# Patient Record
Sex: Female | Born: 1995 | Hispanic: Yes | Smoking: Never smoker
Health system: Southern US, Community
[De-identification: ages and names within clinical notes are randomized; demographics above are authoritative.]

## PROBLEM LIST (undated history)

## (undated) ENCOUNTER — Inpatient Hospital Stay (HOSPITAL_COMMUNITY): Payer: Self-pay

## (undated) ENCOUNTER — Inpatient Hospital Stay (HOSPITAL_COMMUNITY): Admission: RE | Payer: Self-pay | Source: Ambulatory Visit

## (undated) DIAGNOSIS — J45909 Unspecified asthma, uncomplicated: Secondary | ICD-10-CM

## (undated) HISTORY — PX: NO PAST SURGERIES: SHX2092

---

## 2015-01-06 ENCOUNTER — Emergency Department (HOSPITAL_COMMUNITY)
Admission: EM | Admit: 2015-01-06 | Discharge: 2015-01-07 | Disposition: A | Discharge status: Home / Self Care | Payer: Self-pay | Attending: Emergency Medicine | Admitting: Emergency Medicine

## 2015-01-06 ENCOUNTER — Encounter (HOSPITAL_COMMUNITY): Payer: Self-pay | Admitting: Emergency Medicine

## 2015-01-06 DIAGNOSIS — R109 Unspecified abdominal pain: Secondary | ICD-10-CM

## 2015-01-06 DIAGNOSIS — O99511 Diseases of the respiratory system complicating pregnancy, first trimester: Secondary | ICD-10-CM | POA: Insufficient documentation

## 2015-01-06 DIAGNOSIS — O9989 Other specified diseases and conditions complicating pregnancy, childbirth and the puerperium: Secondary | ICD-10-CM | POA: Insufficient documentation

## 2015-01-06 DIAGNOSIS — R1031 Right lower quadrant pain: Secondary | ICD-10-CM | POA: Insufficient documentation

## 2015-01-06 DIAGNOSIS — J45909 Unspecified asthma, uncomplicated: Secondary | ICD-10-CM | POA: Insufficient documentation

## 2015-01-06 DIAGNOSIS — Z3201 Encounter for pregnancy test, result positive: Secondary | ICD-10-CM

## 2015-01-06 DIAGNOSIS — N831 Corpus luteum cyst of ovary, unspecified side: Secondary | ICD-10-CM

## 2015-01-06 DIAGNOSIS — R197 Diarrhea, unspecified: Secondary | ICD-10-CM | POA: Insufficient documentation

## 2015-01-06 DIAGNOSIS — Z3A01 Less than 8 weeks gestation of pregnancy: Secondary | ICD-10-CM | POA: Insufficient documentation

## 2015-01-06 HISTORY — DX: Unspecified asthma, uncomplicated: J45.909

## 2015-01-06 LAB — CBC WITH DIFFERENTIAL/PLATELET
Basophils Absolute: 0 10*3/uL (ref 0.0–0.1)
Basophils Relative: 0 % (ref 0–1)
Eosinophils Absolute: 0.2 10*3/uL (ref 0.0–0.7)
Eosinophils Relative: 3 % (ref 0–5)
HEMATOCRIT: 36.5 % (ref 36.0–46.0)
Hemoglobin: 12.2 g/dL (ref 12.0–15.0)
Lymphocytes Relative: 30 % (ref 12–46)
Lymphs Abs: 2.1 10*3/uL (ref 0.7–4.0)
MCH: 29.6 pg (ref 26.0–34.0)
MCHC: 33.4 g/dL (ref 30.0–36.0)
MCV: 88.6 fL (ref 78.0–100.0)
MONO ABS: 0.6 10*3/uL (ref 0.1–1.0)
Monocytes Relative: 9 % (ref 3–12)
NEUTROS PCT: 58 % (ref 43–77)
Neutro Abs: 4 10*3/uL (ref 1.7–7.7)
Platelets: 224 10*3/uL (ref 150–400)
RBC: 4.12 MIL/uL (ref 3.87–5.11)
RDW: 12.4 % (ref 11.5–15.5)
WBC: 6.9 10*3/uL (ref 4.0–10.5)

## 2015-01-06 LAB — URINALYSIS, ROUTINE W REFLEX MICROSCOPIC
BILIRUBIN URINE: NEGATIVE
Glucose, UA: NEGATIVE mg/dL
HGB URINE DIPSTICK: NEGATIVE
Ketones, ur: NEGATIVE mg/dL
NITRITE: POSITIVE — AB
Protein, ur: NEGATIVE mg/dL
Specific Gravity, Urine: 1.02 (ref 1.005–1.030)
UROBILINOGEN UA: 0.2 mg/dL (ref 0.0–1.0)
pH: 6 (ref 5.0–8.0)

## 2015-01-06 LAB — COMPREHENSIVE METABOLIC PANEL
ALBUMIN: 4.3 g/dL (ref 3.5–5.0)
ALT: 22 U/L (ref 14–54)
AST: 24 U/L (ref 15–41)
Alkaline Phosphatase: 89 U/L (ref 38–126)
Anion gap: 9 (ref 5–15)
BUN: 9 mg/dL (ref 6–20)
CALCIUM: 9.4 mg/dL (ref 8.9–10.3)
CO2: 23 mmol/L (ref 22–32)
Chloride: 107 mmol/L (ref 101–111)
Creatinine, Ser: 0.42 mg/dL — ABNORMAL LOW (ref 0.44–1.00)
GFR calc non Af Amer: >60 mL/min (ref >60)
Glucose, Bld: 80 mg/dL (ref 70–99)
Potassium: 3.6 mmol/L (ref 3.5–5.1)
Sodium: 139 mmol/L (ref 135–145)
TOTAL PROTEIN: 7.4 g/dL (ref 6.5–8.1)
Total Bilirubin: 0.5 mg/dL (ref 0.3–1.2)

## 2015-01-06 LAB — URINE MICROSCOPIC-ADD ON

## 2015-01-06 LAB — LIPASE, BLOOD: LIPASE: 27 U/L (ref 22–51)

## 2015-01-06 LAB — POC URINE PREG, ED: Preg Test, Ur: POSITIVE — AB

## 2015-01-06 MED ORDER — ACETAMINOPHEN 325 MG PO TABS
650.0000 mg | ORAL_TABLET | Freq: Once | ORAL | Status: AC
  Administered 2015-01-07: 650 mg via ORAL
  Filled 2015-01-06: qty 2

## 2015-01-06 MED ORDER — HYDROCODONE-ACETAMINOPHEN 5-325 MG PO TABS
1.0000 | ORAL_TABLET | Freq: Once | ORAL | Status: DC
  Filled 2015-01-06: qty 1

## 2015-01-06 NOTE — ED Notes (Signed)
Pt. reports low abdominal pain with nausea , emesis, diarrhea and chills onset yesterday , denies dysuria .

## 2015-01-06 NOTE — ED Provider Notes (Signed)
CSN: [id number]     Arrival date & time 01/06/15  2212 History   First MD Initiated Contact with Patient 01/06/15 2304     Chief Complaint  Patient presents with  . Abdominal Pain    (Consider location/radiation/quality/duration/timing/severity/associated sxs/prior Treatment) HPI  Kaitlyn Hardin is a 19 yo female presenting with report of abd pain and positive home pregnancy test. She states she started having intermittent cramping abd pain yesterday.  Her period was late, so she took a home pregnancy test and it was positive. She reports an abnormal period April 1st -April 8th She states she has had some nausea also and one episode of vomiting yesterday.  She states the pain is currently improved but it tends to come and go. She denies any fevers, chills, vomiting, vaginal discharge, vaginal bleeding or dysuria.   Past Medical History  Diagnosis Date  . Asthma    History reviewed. No pertinent past surgical history. No family history on file. History  Substance Use Topics  . Smoking status: Never Smoker   . Smokeless tobacco: Not on file  . Alcohol Use: No   OB History    No data available     Review of Systems  Constitutional: Negative for fever and chills.  HENT: Negative for sore throat.   Eyes: Negative for visual disturbance.  Respiratory: Negative for cough and shortness of breath.   Cardiovascular: Negative for chest pain and leg swelling.  Gastrointestinal: Positive for nausea, abdominal pain and diarrhea. Negative for vomiting.  Genitourinary: Positive for menstrual problem. Negative for dysuria.  Musculoskeletal: Negative for myalgias.  Skin: Negative for rash.  Neurological: Negative for weakness, numbness and headaches.    Allergies  Review of patient's allergies indicates no known allergies.  Home Medications   Prior to Admission medications   Not on File   BP 126/82 mmHg  Pulse 95  Temp(Src) 97.4 F (36.3 C) (Oral)  Resp 18  Ht 4\' 11"  (1.499 m)  Wt  96 lb 12.8 oz (43.908 kg)  BMI 19.54 kg/m2  SpO2 99%  LMP 12/09/2014 Physical Exam  Constitutional: She appears well-developed and well-nourished. No distress.  HENT:  Head: Normocephalic and atraumatic.  Mouth/Throat: Oropharynx is clear and moist. No oropharyngeal exudate.  Eyes: Conjunctivae are normal.  Neck: Neck supple. No thyromegaly present.  Cardiovascular: Normal rate, regular rhythm and intact distal pulses.   Pulmonary/Chest: Effort normal and breath sounds normal. No respiratory distress. She has no wheezes. She has no rales. She exhibits no tenderness.  Abdominal: Soft. She exhibits no distension and no mass. There is tenderness in the right lower quadrant. There is no rigidity, no rebound, no guarding, no tenderness at McBurney's point and negative Murphy's sign.    Musculoskeletal: She exhibits no tenderness.  Lymphadenopathy:    She has no cervical adenopathy.  Neurological: She is alert.  Skin: Skin is warm and dry. No rash noted. She is not diaphoretic.  Psychiatric: She has a normal mood and affect.  Nursing note and vitals reviewed.   ED Course  Procedures (including critical care time) Labs Review Labs Reviewed  COMPREHENSIVE METABOLIC PANEL - Abnormal; Notable for the following:    Creatinine, Ser 0.42 (*)    All other components within normal limits  URINALYSIS, ROUTINE W REFLEX MICROSCOPIC - Abnormal; Notable for the following:    APPearance CLOUDY (*)    Nitrite POSITIVE (*)    Leukocytes, UA MODERATE (*)    All other components within normal limits  URINE MICROSCOPIC-ADD  ON - Abnormal; Notable for the following:    Squamous Epithelial / LPF MANY (*)    Bacteria, UA MANY (*)    All other components within normal limits  HCG, QUANTITATIVE, PREGNANCY - Abnormal; Notable for the following:    hCG, Beta Chain, Quant, S 297 (*)    All other components within normal limits  POC URINE PREG, ED - Abnormal; Notable for the following:    Preg Test, Ur  POSITIVE (*)    All other components within normal limits  CBC WITH DIFFERENTIAL/PLATELET  LIPASE, BLOOD    Imaging Review US Ob Comp Less 14 Wks  01/07/2015   CLINICAL DATA:  Lower abdominal pain, last menstrual period November 11, 2014. Assess for ectopic pregnancy.  EXAM: OBSTETRIC <14 WK [place] AND TRANSVAGINAL OB US  TECHNIQUE: Both transabdominal and transvaginal ultrasound examinations were performed for complete evaluation of the gestation as well as the maternal uterus, adnexal regions, and pelvic cul-de-sac. Transvaginal technique was performed to assess early pregnancy.  COMPARISON:  None.  FINDINGS: Intrauterine gestational sac: Not present  Yolk sac:  Not present  Embryo:  Not present  Cardiac Activity: Not present  Maternal uterus/adnexae: RIGHT ovary 3.1 x 2.3 x 2.4 cm, LEFT is 3.5 x 2.9 x 2.7 cm. Bilateral adnexa did not demonstrate normal morphology and echogenicity, suspect is corpus luteal cyst on the LEFT measuring up to 17 mm. Endometrium is distended 18 mm with homogeneous echogenicity. Small amount of free fluid in the pelvis.  IMPRESSION: No intrauterine pregnancy identified. Suspected LEFT corpus luteal cyst, without imaging characteristics of ectopic pregnancy though, Doppler sonography not applied. Recommend serial HCG and follow-up pelvic ultrasound in 10-12 days.  Acute findings discussed with and reconfirmed by Dr.KEVIN CAMPOS on 01/07/2015 at 1:57 am.   Electronically Signed   By: Awilda Metro   On: 01/07/2015 01:57   US Ob Transvaginal  01/07/2015   CLINICAL DATA:  Lower abdominal pain, last menstrual period November 11, 2014. Assess for ectopic pregnancy.  EXAM: OBSTETRIC <14 WK [place] AND TRANSVAGINAL OB US  TECHNIQUE: Both transabdominal and transvaginal ultrasound examinations were performed for complete evaluation of the gestation as well as the maternal uterus, adnexal regions, and pelvic cul-de-sac. Transvaginal technique was performed to assess early pregnancy.  COMPARISON:   None.  FINDINGS: Intrauterine gestational sac: Not present  Yolk sac:  Not present  Embryo:  Not present  Cardiac Activity: Not present  Maternal uterus/adnexae: RIGHT ovary 3.1 x 2.3 x 2.4 cm, LEFT is 3.5 x 2.9 x 2.7 cm. Bilateral adnexa did not demonstrate normal morphology and echogenicity, suspect is corpus luteal cyst on the LEFT measuring up to 17 mm. Endometrium is distended 18 mm with homogeneous echogenicity. Small amount of free fluid in the pelvis.  IMPRESSION: No intrauterine pregnancy identified. Suspected LEFT corpus luteal cyst, without imaging characteristics of ectopic pregnancy though, Doppler sonography not applied. Recommend serial HCG and follow-up pelvic ultrasound in 10-12 days.  Acute findings discussed with and reconfirmed by Dr.KEVIN CAMPOS on 01/07/2015 at 1:57 am.   Electronically Signed   By: Awilda Metro   On: 01/07/2015 01:57     EKG Interpretation None      MDM   Final diagnoses:  Corpus luteum cyst  Positive pregnancy test  RLQ abdominal pain   19 yo female presenting with abd cramping, right sided abd pain and positive home pregnancy test. Discussed case with Dr. Patria Mane.  Her urine pregnancy is positive, her UA shows moderate leukocytes and  nitrites despite no urinary symptoms. Quant HCG is low considering her LMP: 297, no other significant lab abnormalities. Transvaginal US shows no IUP, but possible left corpus luteal cyst. As pt's pain is right sided, pt is safe to be discharged with [facility] for serial HCGs. Her pain was managed in the ED. Pt is well-appearing, in no acute distress and vital signs reviewed and not concerning. She appears safe to be discharged.  Return precautions provided. Pt aware of plan and in agreement.  Filed Vitals:   01/07/15 0215 01/07/15 0230 01/07/15 0245 01/07/15 0256  BP: 103/69 101/64 99/65 106/68  Pulse: 97 93 96 108  Temp:    98.8 F (37.1 C)  TempSrc:    Oral  Resp:    14   Height:      Weight:      SpO2: 99% 100% 100% 100%   Meds given in ED:  Medications  acetaminophen (TYLENOL) tablet 650 mg (650 mg Oral Given 01/07/15 0007)    There are no discharge medications for this patient.     Harle BattiestElizabeth Antawan Mchugh, NP 01/07/15 1528  Azalia BilisKevin Campos, MD 01/08/15 [id number]

## 2015-01-06 NOTE — ED Notes (Signed)
Dr. campos at bedside

## 2015-01-07 ENCOUNTER — Emergency Department (HOSPITAL_COMMUNITY): Payer: Self-pay

## 2015-01-07 LAB — HCG, QUANTITATIVE, PREGNANCY: HCG, BETA CHAIN, QUANT, S: 297 m[IU]/mL — AB (ref <5)

## 2015-01-07 NOTE — ED Notes (Signed)
Pt. Left with all belongings and refused wheelchair 

## 2015-01-07 NOTE — Discharge Instructions (Signed)
Please follow the directions provided. Is very important for you to follow-up at the [facility] in 48 hours, (Sunday morning) for a re-check of your pregnancy blood levels.  You may take tylenol every 4 hours for pain.  Don't hesitate to return for any new, worsening or concerning symptoms.     SEEK IMMEDIATE MEDICAL CARE IF:  You have heavy bleeding from the vagina.  Your pelvic pain increases.  You feel light-headed or faint.  You have chills.  You have pain with urination or blood in your urine.  You have uncontrolled diarrhea or vomiting.  You have a fever or persistent symptoms for more than 3 days.  You have a fever and your symptoms suddenly get worse.  You are being physically or sexually abused.

## 2015-01-07 NOTE — ED Notes (Signed)
Patient transported to US - With [place] NT

## 2015-01-07 NOTE — ED Notes (Addendum)
Ultrasound requests that a female chaperon accompany pt

## 2015-01-09 ENCOUNTER — Inpatient Hospital Stay (HOSPITAL_COMMUNITY)
Admission: AD | Admit: 2015-01-09 | Discharge: 2015-01-09 | Disposition: A | Discharge status: Home / Self Care | Payer: Self-pay | Source: Ambulatory Visit | Attending: Obstetrics & Gynecology | Admitting: Obstetrics & Gynecology

## 2015-01-09 ENCOUNTER — Encounter (HOSPITAL_COMMUNITY): Payer: Self-pay | Admitting: *Deleted

## 2015-01-09 DIAGNOSIS — O26899 Other specified pregnancy related conditions, unspecified trimester: Secondary | ICD-10-CM

## 2015-01-09 DIAGNOSIS — Z3A01 Less than 8 weeks gestation of pregnancy: Secondary | ICD-10-CM | POA: Insufficient documentation

## 2015-01-09 DIAGNOSIS — R109 Unspecified abdominal pain: Secondary | ICD-10-CM | POA: Insufficient documentation

## 2015-01-09 DIAGNOSIS — O9989 Other specified diseases and conditions complicating pregnancy, childbirth and the puerperium: Secondary | ICD-10-CM | POA: Insufficient documentation

## 2015-01-09 LAB — URINALYSIS, ROUTINE W REFLEX MICROSCOPIC
BILIRUBIN URINE: NEGATIVE
Glucose, UA: NEGATIVE mg/dL
HGB URINE DIPSTICK: NEGATIVE
KETONES UR: NEGATIVE mg/dL
NITRITE: POSITIVE — AB
PROTEIN: NEGATIVE mg/dL
Specific Gravity, Urine: 1.015 (ref 1.005–1.030)
UROBILINOGEN UA: 0.2 mg/dL (ref 0.0–1.0)
pH: 6.5 (ref 5.0–8.0)

## 2015-01-09 LAB — HCG, QUANTITATIVE, PREGNANCY: hCG, Beta Chain, Quant, S: 981 m[IU]/mL — ABNORMAL HIGH (ref <5)

## 2015-01-09 LAB — URINE MICROSCOPIC-ADD ON

## 2015-01-09 NOTE — Discharge Instructions (Signed)
First Trimester of Pregnancy The first trimester of pregnancy is from week 1 until the end of week 12 (months 1 through 3). During this time, your baby will begin to develop inside you. At 6-8 weeks, the eyes and face are formed, and the heartbeat can be seen on ultrasound. At the end of 12 weeks, all the baby's organs are formed. Prenatal care is all the medical care you receive before the birth of your baby. Make sure you get good prenatal care and follow all of your doctor's instructions. HOME CARE  Medicines  Take medicine only as told by your doctor. Some medicines are safe and some are not during pregnancy.  Take your prenatal vitamins as told by your doctor.  Take medicine that helps you poop (stool softener) as needed if your doctor says it is okay. Diet  Eat regular, healthy meals.  Your doctor will tell you the amount of weight gain that is right for you.  Avoid raw meat and uncooked cheese.  If you feel sick to your stomach (nauseous) or throw up (vomit):  Eat 4 or 5 small meals a day instead of 3 large meals.  Try eating a few soda crackers.  Drink liquids between meals instead of during meals.  If you have a hard time pooping (constipation):  Eat high-fiber foods like fresh vegetables, fruit, and whole grains.  Drink enough fluids to keep your pee (urine) clear or pale yellow. Activity and Exercise  Exercise only as told by your doctor. Stop exercising if you have cramps or pain in your lower belly (abdomen) or low back.  Try to avoid standing for long periods of time. Move your legs often if you must stand in one place for a long time.  Avoid heavy lifting.  Wear low-heeled shoes. Sit and stand up straight.  You can have sex unless your doctor tells you not to. Relief of Pain or Discomfort  Wear a good support bra if your breasts are sore.  Take warm water baths (sitz baths) to soothe pain or discomfort caused by hemorrhoids. Use hemorrhoid cream if your  doctor says it is okay.  Rest with your legs raised if you have leg cramps or low back pain.  Wear support hose if you have puffy, bulging veins (varicose veins) in your legs. Raise (elevate) your feet for 15 minutes, 3-4 times a day. Limit salt in your diet. Prenatal Care  Schedule your prenatal visits by the twelfth week of pregnancy.  Write down your questions. Take them to your prenatal visits.  Keep all your prenatal visits as told by your doctor. Safety  Wear your seat belt at all times when driving.  Make a list of emergency phone numbers. The list should include numbers for family, friends, the hospital, and police and fire departments. General Tips  Ask your doctor for a referral to a local prenatal class. Begin classes no later than at the start of month 6 of your pregnancy.  Ask for help if you need counseling or help with nutrition. Your doctor can give you advice or tell you where to go for help.  Do not use hot tubs, steam rooms, or saunas.  Do not douche or use tampons or scented sanitary pads.  Do not cross your legs for long periods of time.  Avoid litter boxes and soil used by cats.  Avoid all smoking, herbs, and alcohol. Avoid drugs not approved by your doctor.  Visit your dentist. At home, brush your teeth   with a soft toothbrush. Be gentle when you floss. GET HELP IF:  You are dizzy.  You have mild cramps or pressure in your lower belly.  You have a nagging pain in your belly area.  You continue to feel sick to your stomach, throw up, or have watery poop (diarrhea).  You have a bad smelling fluid coming from your vagina.  You have pain with peeing (urination).  You have increased puffiness (swelling) in your face, hands, legs, or ankles. GET HELP RIGHT AWAY IF:   You have a fever.  You are leaking fluid from your vagina.  You have spotting or bleeding from your vagina.  You have very bad belly cramping or pain.  You gain or lose weight  rapidly.  You throw up blood. It may look like coffee grounds.  You are around people who have German measles, fifth disease, or chickenpox.  You have a very bad headache.  You have shortness of breath.  You have any kind of trauma, such as from a fall or a car accident. Document Released: 02/06/2008 Document Revised: 01/04/2014 Document Reviewed: 06/30/2013 ExitCare Patient Information 2015 [employer]. This information is not intended to replace advice given to you by your health care provider. Make sure you discuss any questions you have with your health care provider.  

## 2015-01-09 NOTE — MAU Provider Note (Signed)
Ms. Kaitlyn Hardin  is a 19 y.o. G1P0 at 724w3d who presents to MAU today for follow-up quant hCG after 48 hours. The patient denies abdominal pain, bleeding, UTI symptoms or fever today. [facility] for abdominal pain in pregnancy that started 01/05/15. US showed no IUGS or YS and quant hCG was 297. Patient denies symptoms of UTI, but had concern for UTI based on UA results. Urine was not cultured and UTI was not treated.   BP 111/65 mmHg  Pulse 94  Temp(Src) 98.2 F (36.8 C) (Oral)  LMP 12/09/2014  CONSTITUTIONAL: Well-developed, well-nourished female in no acute distress.  ENT: External right and left ear normal.  EYES: EOM intact, conjunctivae normal.  MUSCULOSKELETAL: Normal range of motion.  CARDIOVASCULAR: Regular heart rate RESPIRATORY: Normal effort NEUROLOGICAL: Alert and oriented to person, place, and time.  SKIN: Skin is warm and dry. No rash noted. Not diaphoretic. No erythema. No pallor. PSYCH: Normal mood and affect. Normal behavior. Normal judgment and thought content.  Results for orders placed or performed during the hospital encounter of 01/09/15 (from the past 24 hour(s))  hCG, quantitative, pregnancy     Status: Abnormal   Collection Time: 01/09/15  3:02 PM  Result Value Ref Range   hCG, Beta Chain, Quant, S 981 (H) <5 mIU/mL  Urinalysis, Routine w reflex microscopic     Status: Abnormal   Collection Time: 01/09/15  3:10 PM  Result Value Ref Range   Color, Urine YELLOW YELLOW   APPearance CLEAR CLEAR   Specific Gravity, Urine 1.015 1.005 - 1.030   pH 6.5 5.0 - 8.0   Glucose, UA NEGATIVE NEGATIVE mg/dL   Hgb urine dipstick NEGATIVE NEGATIVE   Bilirubin Urine NEGATIVE NEGATIVE   Ketones, ur NEGATIVE NEGATIVE mg/dL   Protein, ur NEGATIVE NEGATIVE mg/dL   Urobilinogen, UA 0.2 0.0 - 1.0 mg/dL   Nitrite POSITIVE (A) NEGATIVE   Leukocytes, UA MODERATE (A) NEGATIVE  Urine microscopic-add on     Status: Abnormal   Collection Time: 01/09/15  3:10 PM   Result Value Ref Range   Squamous Epithelial / LPF FEW (A) RARE   WBC, UA 11-20 <3 WBC/hpf   Bacteria, UA MANY (A) RARE   Urine-Other MUCOUS PRESENT    Results for Kaitlyn Hardin, [name] (MRN [id number]) as of 01/09/2015 15:57  Ref. Range 01/06/2015 22:22 01/06/2015 22:48 01/07/2015 01:32 01/09/2015 15:02  HCG, Beta Chain, Quant, S Latest Ref Range: <5 mIU/mL 297 (H)   981 (H)    MDM Patient asymptomatic of UTI, urine culture sent  A: Appropriate rise in quant hCG after 48 hours Abdominal pain in pregnancy Pregnancy of unknown location  P: Discharge home First trimester/ectopic precautions discussed Patient will return for follow-up US in 1 week. Order placed. They will call the patient with an appointment time. Patient to return to MAU after US for results Patient may return to MAU as needed or if her condition were to change or worsen   Marny LowensteinJulie N Wenzel, PA-C 01/09/2015 3:54 PM

## 2015-01-09 NOTE — MAU Note (Signed)
Pt presents to MAU for repeat  BHCG. Denies any vaginal bleeding or pain 

## 2015-01-11 LAB — CULTURE, OB URINE: Colony Count: >=100000

## 2015-01-12 ENCOUNTER — Telehealth (HOSPITAL_COMMUNITY): Payer: Self-pay | Admitting: Obstetrics and Gynecology

## 2015-01-12 NOTE — Telephone Encounter (Signed)
+   urine culture. Keflex sent to the pharmacy.

## 2015-01-14 ENCOUNTER — Encounter: Payer: Self-pay | Admitting: Medical

## 2015-01-14 ENCOUNTER — Telehealth: Payer: Self-pay | Admitting: Medical

## 2015-01-14 NOTE — Telephone Encounter (Signed)
+   urine culture. Patient needs Rx for Keflex. No Pharmacy listed in Epic. Attempted to contact patient x 3. Also attempted to contact emergency contact without answer. Certified letter sent asking patient to call MAU for results. Will need to confirm pharmacy and send Rx.   Marny LowensteinJulie N Lasheka Kempner, PA-C 01/14/2015 8:25 AM

## 2015-01-17 ENCOUNTER — Inpatient Hospital Stay (HOSPITAL_COMMUNITY)
Admission: AD | Admit: 2015-01-17 | Discharge: 2015-01-17 | Disposition: A | Discharge status: Home / Self Care | Payer: Self-pay | Source: Ambulatory Visit | Attending: Family Medicine | Admitting: Family Medicine

## 2015-01-17 ENCOUNTER — Ambulatory Visit (HOSPITAL_COMMUNITY)
Admission: RE | Admit: 2015-01-17 | Discharge: 2015-01-17 | Disposition: A | Discharge status: Home / Self Care | Payer: Self-pay | Source: Ambulatory Visit | Attending: Medical | Admitting: Medical

## 2015-01-17 DIAGNOSIS — O9989 Other specified diseases and conditions complicating pregnancy, childbirth and the puerperium: Secondary | ICD-10-CM | POA: Insufficient documentation

## 2015-01-17 DIAGNOSIS — O26899 Other specified pregnancy related conditions, unspecified trimester: Secondary | ICD-10-CM

## 2015-01-17 DIAGNOSIS — R109 Unspecified abdominal pain: Secondary | ICD-10-CM | POA: Insufficient documentation

## 2015-01-17 MED ORDER — CEPHALEXIN 500 MG PO CAPS: 500.0000 mg | ORAL_CAPSULE | Freq: Four times a day (QID) | ORAL | Status: DC

## 2015-01-17 NOTE — MAU Provider Note (Signed)
Patient here for a follow up US. Discussed results with the patient and answered all questions. Patient has already started prenatal care. Patient had a positive urine culture and we were unable to reach her by phone. Keflex sent to her pharmacy today. She denies vaginal bleeding.   [place] Ob Transvaginal  01/17/2015   CLINICAL DATA:  Pregnant, unsure LMP  EXAM: TRANSVAGINAL OB ULTRASOUND  TECHNIQUE: Transvaginal ultrasound was performed for complete evaluation of the gestation as well as the maternal uterus, adnexal regions, and pelvic cul-de-sac.  COMPARISON:  None.  FINDINGS: Intrauterine gestational sac: Visualized/normal in shape.  Yolk sac:  Present  Embryo:  Not visualized  MSD: 9.6  mm   5 w   5  d  Maternal uterus/adnexae: No subchronic hemorrhage.  Bilateral ovaries are within normal limits.  Trace pelvic fluid.  IMPRESSION: Single intrauterine gestation with yolk sac, measuring 5 weeks 5 days by mean sac diameter.  No fetal pole is visualized.  Consider follow-up pelvic ultrasound in 14 days to confirm viability as clinically warranted.   Electronically Signed   By: Charline BillsSriyesh  Krishnan M.D.   On: 01/17/2015 14:31        Duane LopeJennifer I Shiya Fogelman, NP 01/17/2015 2:50 PM

## 2015-01-23 ENCOUNTER — Encounter (HOSPITAL_COMMUNITY): Payer: Self-pay | Admitting: Emergency Medicine

## 2015-01-23 ENCOUNTER — Emergency Department (HOSPITAL_COMMUNITY)
Admission: EM | Admit: 2015-01-23 | Discharge: 2015-01-24 | Disposition: A | Discharge status: Home / Self Care | Payer: Self-pay | Attending: Emergency Medicine | Admitting: Emergency Medicine

## 2015-01-23 DIAGNOSIS — O2341 Unspecified infection of urinary tract in pregnancy, first trimester: Secondary | ICD-10-CM | POA: Insufficient documentation

## 2015-01-23 DIAGNOSIS — N390 Urinary tract infection, site not specified: Secondary | ICD-10-CM

## 2015-01-23 DIAGNOSIS — O99511 Diseases of the respiratory system complicating pregnancy, first trimester: Secondary | ICD-10-CM | POA: Insufficient documentation

## 2015-01-23 DIAGNOSIS — O9989 Other specified diseases and conditions complicating pregnancy, childbirth and the puerperium: Secondary | ICD-10-CM | POA: Insufficient documentation

## 2015-01-23 DIAGNOSIS — O219 Vomiting of pregnancy, unspecified: Secondary | ICD-10-CM

## 2015-01-23 DIAGNOSIS — O210 Mild hyperemesis gravidarum: Secondary | ICD-10-CM | POA: Insufficient documentation

## 2015-01-23 DIAGNOSIS — Z3A01 Less than 8 weeks gestation of pregnancy: Secondary | ICD-10-CM | POA: Insufficient documentation

## 2015-01-23 DIAGNOSIS — J45909 Unspecified asthma, uncomplicated: Secondary | ICD-10-CM | POA: Insufficient documentation

## 2015-01-23 DIAGNOSIS — R000 Tachycardia, unspecified: Secondary | ICD-10-CM | POA: Insufficient documentation

## 2015-01-23 LAB — I-STAT CHEM 8, ED
BUN: 6 mg/dL (ref 6–20)
CALCIUM ION: 1.18 mmol/L (ref 1.12–1.23)
CREATININE: 0.4 mg/dL — AB (ref 0.44–1.00)
Chloride: 102 mmol/L (ref 101–111)
Glucose, Bld: 97 mg/dL (ref 65–99)
HCT: 37 % (ref 36.0–46.0)
HEMOGLOBIN: 12.6 g/dL (ref 12.0–15.0)
POTASSIUM: 3.8 mmol/L (ref 3.5–5.1)
SODIUM: 136 mmol/L (ref 135–145)
TCO2: 20 mmol/L (ref 0–100)

## 2015-01-23 MED ORDER — PROMETHAZINE HCL 25 MG/ML IJ SOLN
25.0000 mg | Freq: Once | INTRAMUSCULAR | Status: AC
  Administered 2015-01-23: 25 mg via INTRAVENOUS
  Filled 2015-01-23: qty 1

## 2015-01-23 MED ORDER — SODIUM CHLORIDE 0.9 % IV BOLUS (SEPSIS)
1000.0000 mL | INTRAVENOUS | Status: AC
Start: 2015-01-23 — End: 2015-01-24
  Administered 2015-01-23: 1000 mL via INTRAVENOUS

## 2015-01-23 MED ORDER — DEXTROSE 5 % IV SOLN: 1.0000 g | Freq: Once | INTRAVENOUS | Status: DC

## 2015-01-23 NOTE — ED Provider Notes (Signed)
CSN: [id number]     Arrival date & time 01/23/15  2137 History   First MD Initiated Contact with Patient 01/23/15 2156     Chief Complaint  Patient presents with  . Emesis  . Dizziness   (Consider location/radiation/quality/duration/timing/severity/associated sxs/prior Treatment) HPI  Kaitlyn Hardin is a 19 yo nausea and vomiting. She is 6 weeks and 4 days pregnant with a confirmed IUP. She presented to the ER, 2 weeks ago with right lower quadrant pain and was sent for follow-up at [facility], MAU for serial HCGs.  She was not having urinary symptoms or vaginal symptoms at her initial presentation to the ED but she reports today that she has been having some dysuria for a few days.  She was prescribed Kelfex after visiting [facility] hospital but has never started taking them because "no one told me how to take them." Her most bothersome complaint is 3 days of nausea and vomiting. She reports 1 episode of vomiting daily for the last 3 days.  She has not followed back up at [facility] because she did not know she was supposed to go back there for other concerns or appointments.  She reports mild suprapubic pain, but no back pain, abdominal cramping, or vaginal bleeding.   Past Medical History  Diagnosis Date  . Asthma    History reviewed. No pertinent past surgical history. No family history on file. History  Substance Use Topics  . Smoking status: Never Smoker   . Smokeless tobacco: Not on file  . Alcohol Use: No   OB History    Gravida Para Term Preterm AB TAB SAB Ectopic Multiple Living   1              Review of Systems  Constitutional: Negative for fever and chills.  HENT: Negative for sore throat.   Eyes: Negative for visual disturbance.  Respiratory: Negative for cough and shortness of breath.   Cardiovascular: Negative for chest pain and leg swelling.  Gastrointestinal: Positive for nausea and vomiting. Negative for diarrhea.  Genitourinary: Negative for dysuria.    Musculoskeletal: Negative for myalgias.  Skin: Negative for rash.  Neurological: Negative for weakness, numbness and headaches.      Allergies  Review of patient's allergies indicates no known allergies.  Home Medications   Prior to Admission medications   Medication Sig Start Date End Date Taking? Authorizing Provider  cephALEXin (KEFLEX) 500 MG capsule Take 1 capsule (500 mg total) by mouth 4 (four) times daily. 01/17/15   Harolyn Rutherford Rasch, NP   BP 105/65 mmHg  Pulse 98  Temp(Src) 98.2 F (36.8 C) (Oral)  Resp 16  Ht  (1.499 m)  Wt 95 lb (43.092 kg)  BMI 19.18 kg/m2  SpO2 100%  LMP 12/09/2014 Physical Exam  Constitutional: She appears well-developed and well-nourished. No distress.  HENT:  Head: Normocephalic and atraumatic.  Mouth/Throat: Oropharynx is clear and moist.  Eyes: Conjunctivae are normal.  Neck: Neck supple.  Cardiovascular: Regular rhythm and intact distal pulses.  Tachycardia present.  Exam reveals no gallop and no friction rub.   No murmur heard. Pulmonary/Chest: Effort normal and breath sounds normal. No respiratory distress. She has no wheezes. She has no rales. She exhibits no tenderness.  Abdominal: Soft. There is tenderness in the suprapubic area.    Musculoskeletal: She exhibits no tenderness.  Lymphadenopathy:    She has no cervical adenopathy.  Neurological: She is alert.  Skin: Skin is warm and dry. No rash noted. She is  not diaphoretic.  Psychiatric: She has a normal mood and affect.  Nursing note and vitals reviewed.   ED Course  Procedures (including critical care time) Labs Review Labs Reviewed  URINALYSIS, ROUTINE W REFLEX MICROSCOPIC - Abnormal; Notable for the following:    Color, Urine AMBER (*)    APPearance CLOUDY (*)    Ketones, ur >80 (*)    Nitrite POSITIVE (*)    Leukocytes, UA LARGE (*)    All other components within normal limits  URINE MICROSCOPIC-ADD ON - Abnormal; Notable for the following:    Squamous  Epithelial / LPF MANY (*)    Bacteria, UA MANY (*)    All other components within normal limits  I-STAT CHEM 8, ED - Abnormal; Notable for the following:    Creatinine, Ser 0.40 (*)    All other components within normal limits  URINE CULTURE    Imaging Review No results found.   EKG Interpretation None      MDM   Final diagnoses:  UTI (lower urinary tract infection)  Nausea and vomiting during pregnancy prior to [redacted] weeks gestation   19 yo, 6 week and 4 day pregnant pt with nausea and vomiting, 1 time/day for 3 days.  [facility] and denies any abd cramping or vaginal bleeding. She has been previously diagnosed with UTI at her OB/GYN appointment but has not taken her antibiotic, which she has at home, because she says no one told her how to take it. Her urine here confirms that she still has her urinary tract infection. She also has greater than 80 ketones, so she was aggressively rehydrated with 2 L of normal saline. She was given a her first dose of antibiotics in the ED and I discussed how to take her antibiotic and how to find the instructions on the bottle about how to take her antibiotic. Her nausea has not resolved after treatment but due to her early pregnancy discussed with patient that nausea symptoms are reassuring that her pregnancy is progressing. Her vomiting has resolved and she is tolerating by mouth's well.  I discussed her case with Dr. Lynelle DoctorKnapp. Pt is well-appearing, in no acute distress and vital signs reviewed and not concerning. She appears safe to be discharged.  Discharge include follow-up with her OB/GYN.  Return precautions provided. Pt aware of plan and in agreement.   Filed Vitals:   01/24/15 0000 01/24/15 0015 01/24/15 0030 01/24/15 0115  BP: 82/60 87/54 89/46  91/47  Pulse:  107 107 110  Temp:      TempSrc:      Resp:      Height:      Weight:      SpO2:  100% 100% 100%   Meds given in ED:  Medications  sodium  chloride 0.9 % bolus 1,000 mL (0 mLs Intravenous Stopped 01/24/15 0042)  promethazine (PHENERGAN) injection 25 mg (25 mg Intravenous Given 01/23/15 2343)  cephALEXin (KEFLEX) capsule 500 mg (500 mg Oral Given 01/24/15 0047)  acetaminophen (TYLENOL) tablet 650 mg (650 mg Oral Given 01/24/15 0047)  sodium chloride 0.9 % bolus 1,000 mL (0 mLs Intravenous Stopped 01/24/15 0201)    Discharge Medication List as of 01/24/2015  1:35 AM         Harle BattiestElizabeth Maddi Collar, NP 01/25/15 [id number]  Devoria AlbeIva Knapp, MD 01/28/15 2304

## 2015-01-23 NOTE — ED Notes (Addendum)
C/o dizziness and vomiting since Thursday.  Denies pain.  Reports vomited x 1 today. States she is [redacted] weeks pregnant.

## 2015-01-24 LAB — URINALYSIS, ROUTINE W REFLEX MICROSCOPIC
Bilirubin Urine: NEGATIVE
GLUCOSE, UA: NEGATIVE mg/dL
Hgb urine dipstick: NEGATIVE
Ketones, ur: >80 mg/dL — AB
NITRITE: POSITIVE — AB
Protein, ur: NEGATIVE mg/dL
Specific Gravity, Urine: 1.026 (ref 1.005–1.030)
Urobilinogen, UA: 0.2 mg/dL (ref 0.0–1.0)
pH: 7.5 (ref 5.0–8.0)

## 2015-01-24 LAB — URINE MICROSCOPIC-ADD ON

## 2015-01-24 MED ORDER — SODIUM CHLORIDE 0.9 % IV BOLUS (SEPSIS)
1000.0000 mL | INTRAVENOUS | Status: AC
  Administered 2015-01-24: 1000 mL via INTRAVENOUS

## 2015-01-24 MED ORDER — ACETAMINOPHEN 325 MG PO TABS
650.0000 mg | ORAL_TABLET | Freq: Once | ORAL | Status: AC
  Administered 2015-01-24: 650 mg via ORAL
  Filled 2015-01-24: qty 2

## 2015-01-24 MED ORDER — PROMETHAZINE HCL 25 MG RE SUPP: 25.0000 mg | Freq: Four times a day (QID) | RECTAL | Status: DC | PRN

## 2015-01-24 MED ORDER — CEPHALEXIN 250 MG PO CAPS
500.0000 mg | ORAL_CAPSULE | Freq: Once | ORAL | Status: AC
  Administered 2015-01-24: 500 mg via ORAL
  Filled 2015-01-24: qty 2

## 2015-01-24 NOTE — Discharge Instructions (Signed)
Please follow the directions provided. Be sure to follow-up at the [facility] regarding your visit tonight. You can call in the morning to make a follow-up appointment in a few days. He may also go 24 hours a day to the [facility] emergency room for any urgent concerns. Continue to drink plenty of fluids by mouth to stay well hydrated. Use the Phenergan as needed for nausea. Please take the Keflex that you have at home to treat the infection in your urine. The directions for how often to take should be on the bottle. Don't hesitate to return for any new, worsening, or concerning symptoms.   SEEK IMMEDIATE MEDICAL CARE IF:  You have severe back pain or lower abdominal pain.  You develop chills.  You have nausea or vomiting.  You have continued burning or discomfort with urination.  SEEK IMMEDIATE MEDICAL CARE IF:  You have persistent and uncontrolled nausea and vomiting.  You pass out (faint

## 2015-01-27 LAB — URINE CULTURE

## 2015-01-28 ENCOUNTER — Telehealth (HOSPITAL_BASED_OUTPATIENT_CLINIC_OR_DEPARTMENT_OTHER): Payer: Self-pay | Admitting: Emergency Medicine

## 2015-01-28 NOTE — Telephone Encounter (Signed)
Post ED Visit - Positive Culture Follow-up  Culture report reviewed by antimicrobial stewardship pharmacist: []  Wes Dulaney, Pharm.D., BCPS []  Celedonio MiyamotoJeremy Frens, Pharm.D., BCPS []  Georgina PillionElizabeth Martin, Pharm.D., BCPS []  ViningMinh Pham, [address]D., BCPS, AAHIVP [x]  Estella HuskMichelle Turner, Pharm.D., BCPS, AAHIVP []  Elder CyphersLorie Poole, [address]Ph[address]D., BCPS  Positive urine  Culture E. coli Treated with keflex, organism sensitive to the same and no further patient follow-up is required at this time.  Kaitlyn MullMiller, Kaitlyn Hardin 01/28/2015, 12:00 PM

## 2015-03-28 ENCOUNTER — Inpatient Hospital Stay (HOSPITAL_COMMUNITY)
Admission: AD | Admit: 2015-03-28 | Discharge: 2015-03-28 | Disposition: A | Discharge status: Home / Self Care | Payer: Medicaid Other | Source: Ambulatory Visit | Attending: Obstetrics & Gynecology | Admitting: Obstetrics & Gynecology

## 2015-03-28 ENCOUNTER — Encounter (HOSPITAL_COMMUNITY): Payer: Self-pay | Admitting: *Deleted

## 2015-03-28 DIAGNOSIS — R1030 Lower abdominal pain, unspecified: Secondary | ICD-10-CM | POA: Diagnosis present

## 2015-03-28 DIAGNOSIS — Z3A14 14 weeks gestation of pregnancy: Secondary | ICD-10-CM | POA: Diagnosis not present

## 2015-03-28 DIAGNOSIS — O2342 Unspecified infection of urinary tract in pregnancy, second trimester: Secondary | ICD-10-CM | POA: Diagnosis not present

## 2015-03-28 LAB — URINALYSIS, ROUTINE W REFLEX MICROSCOPIC
BILIRUBIN URINE: NEGATIVE
GLUCOSE, UA: NEGATIVE mg/dL
Ketones, ur: 15 mg/dL — AB
NITRITE: NEGATIVE
PH: 7 (ref 5.0–8.0)
PROTEIN: NEGATIVE mg/dL
Specific Gravity, Urine: 1.025 (ref 1.005–1.030)
UROBILINOGEN UA: 0.2 mg/dL (ref 0.0–1.0)

## 2015-03-28 LAB — URINE MICROSCOPIC-ADD ON

## 2015-03-28 MED ORDER — CEPHALEXIN 250 MG PO CAPS: 250.0000 mg | ORAL_CAPSULE | Freq: Four times a day (QID) | ORAL | Status: DC

## 2015-03-28 NOTE — Discharge Instructions (Signed)
Second Trimester of Pregnancy °The second trimester is from week 13 through week 28, months 4 through 6. The second trimester is often a time when you feel your best. Your body has also adjusted to being pregnant, and you begin to feel better physically. Usually, morning sickness has lessened or quit completely, you may have more energy, and you may have an increase in appetite. The second trimester is also a time when the fetus is growing rapidly. At the end of the sixth month, the fetus is about 9 inches long and weighs about 1½ pounds. You will likely begin to feel the baby move (quickening) between 18 and 20 weeks of the pregnancy. °BODY CHANGES °Your body goes through many changes during pregnancy. The changes vary from woman to woman.  °· Your weight will continue to increase. You will notice your lower abdomen bulging out. °· You may begin to get stretch marks on your hips, abdomen, and breasts. °· You may develop headaches that can be relieved by medicines approved by your health care provider. °· You may urinate more often because the fetus is pressing on your bladder. °· You may develop or continue to have heartburn as a result of your pregnancy. °· You may develop constipation because certain hormones are causing the muscles that push waste through your intestines to slow down. °· You may develop hemorrhoids or swollen, bulging veins (varicose veins). °· You may have back pain because of the weight gain and pregnancy hormones relaxing your joints between the bones in your pelvis and as a result of a shift in weight and the muscles that support your balance. °· Your breasts will continue to grow and be tender. °· Your gums may bleed and may be sensitive to brushing and flossing. °· Dark spots or blotches (chloasma, mask of pregnancy) may develop on your face. This will likely fade after the baby is born. °· A dark line from your belly button to the pubic area (linea nigra) may appear. This will likely fade  after the baby is born. °· You may have changes in your hair. These can include thickening of your hair, rapid growth, and changes in texture. Some women also have hair loss during or after pregnancy, or hair that feels dry or thin. Your hair will most likely return to normal after your baby is born. °WHAT TO EXPECT AT YOUR PRENATAL VISITS °During a routine prenatal visit: °· You will be weighed to make sure you and the fetus are growing normally. °· Your blood pressure will be taken. °· Your abdomen will be measured to track your baby's growth. °· The fetal heartbeat will be listened to. °· Any test results from the previous visit will be discussed. °Your health care provider may ask you: °· How you are feeling. °· If you are feeling the baby move. °· If you have had any abnormal symptoms, such as leaking fluid, bleeding, severe headaches, or abdominal cramping. °· If you have any questions. °Other tests that may be performed during your second trimester include: °· Blood tests that check for: °¨ Low iron levels (anemia). °¨ Gestational diabetes (between 24 and 28 weeks). °¨ Rh antibodies. °· Urine tests to check for infections, diabetes, or protein in the urine. °· An ultrasound to confirm the proper growth and development of the baby. °· An amniocentesis to check for possible genetic problems. °· Fetal screens for spina bifida and Down syndrome. °HOME CARE INSTRUCTIONS  °· Avoid all smoking, herbs, alcohol, and unprescribed   drugs. These chemicals affect the formation and growth of the baby.  Follow your health care provider's instructions regarding medicine use. There are medicines that are either safe or unsafe to take during pregnancy.  Exercise only as directed by your health care provider. Experiencing uterine cramps is a good sign to stop exercising.  Continue to eat regular, healthy meals.  Wear a good support bra for breast tenderness.  Do not use hot tubs, steam rooms, or saunas.  Wear your  seat belt at all times when driving.  Avoid raw meat, uncooked cheese, cat litter boxes, and soil used by cats. These carry germs that can cause birth defects in the baby.  Take your prenatal vitamins.  Try taking a stool softener (if your health care provider approves) if you develop constipation. Eat more high-fiber foods, such as fresh vegetables or fruit and whole grains. Drink plenty of fluids to keep your urine clear or pale yellow.  Take warm sitz baths to soothe any pain or discomfort caused by hemorrhoids. Use hemorrhoid cream if your health care provider approves.  If you develop varicose veins, wear support hose. Elevate your feet for 15 minutes, 3-4 times a day. Limit salt in your diet.  Avoid heavy lifting, wear low heel shoes, and practice good posture.  Rest with your legs elevated if you have leg cramps or low back pain.  Visit your dentist if you have not gone yet during your pregnancy. Use a soft toothbrush to brush your teeth and be gentle when you floss.  A sexual relationship may be continued unless your health care provider directs you otherwise.  Continue to go to all your prenatal visits as directed by your health care provider. SEEK MEDICAL CARE IF:   You have dizziness.  You have mild pelvic cramps, pelvic pressure, or nagging pain in the abdominal area.  You have persistent nausea, vomiting, or diarrhea.  You have a bad smelling vaginal discharge.  You have pain with urination. SEEK IMMEDIATE MEDICAL CARE IF:   You have a fever.  You are leaking fluid from your vagina.  You have spotting or bleeding from your vagina.  You have severe abdominal cramping or pain.  You have rapid weight gain or loss.  You have shortness of breath with chest pain.  You notice sudden or extreme swelling of your face, hands, ankles, feet, or legs.  You have not felt your baby move in over an hour.  You have severe headaches that do not go away with  medicine.  You have vision changes. Document Released: 08/14/2001 Document Revised: 08/25/2013 Document Reviewed: 10/21/2012 [facility] Patient Information 2015 [place], [place]. This information is not intended to replace advice given to you by your health care provider. Make sure you discuss any questions you have with your health care provider. Urinary Tract Infection A urinary tract infection (UTI) can occur any place along the urinary tract. The tract includes the kidneys, ureters, bladder, and urethra. A type of germ called bacteria often causes a UTI. UTIs are often helped with antibiotic medicine.  HOME CARE   If given, take antibiotics as told by your doctor. Finish them even if you start to feel better.  Drink enough fluids to keep your pee (urine) clear or pale yellow.  Avoid tea, drinks with caffeine, and bubbly (carbonated) drinks.  Pee often. Avoid holding your pee in for a long time.  Pee before and after having sex (intercourse).  Wipe from front to back after you poop (bowel  movement) if you are a woman. Use each tissue only once. GET HELP RIGHT AWAY IF:   You have back pain.  You have lower belly (abdominal) pain.  You have chills.  You feel sick to your stomach (nauseous).  You throw up (vomit).  Your burning or discomfort with peeing does not go away.  You have a fever.  Your symptoms are not better in 3 days. MAKE SURE YOU:   Understand these instructions.  Will watch your condition.  Will get help right away if you are not doing well or get worse. Document Released: 02/06/2008 Document Revised: 05/14/2012 Document Reviewed: 03/20/2012 [facility] Patient Information 2015 [place], [place]. This information is not intended to replace advice given to you by your health care provider. Make sure you discuss any questions you have with your health care provider.

## 2015-03-28 NOTE — MAU Provider Note (Signed)
History     CSN: [id number]  Arrival date and time: 03/28/15 1602   None     Chief Complaint  Patient presents with  . Abdominal Pain   HPI Kaitlyn Hardin is 19 y.o. G1P0 [redacted]w[redacted]d weeks presenting with lower abdominal pain that began yesterday and dysuria X 1 week.   She denies vaginal bleeding and discharge.   She has not begun prenatal care- waiting for insurance then plans to see [facility].  Seen in MAU Mid-May with + urine culture. Rx was sent, certified letter was sent when contact with patient could not be made.  She states she did not get a letter.  The person with her told me the address may be correct.   Past Medical History  Diagnosis Date  . Asthma     No past surgical history on file.  No family history on file.  History  Substance Use Topics  . Smoking status: Never Smoker   . Smokeless tobacco: Not on file  . Alcohol Use: No    Allergies: No Known Allergies  Prescriptions prior to admission  Medication Sig Dispense Refill Last Dose  . Prenatal Vit-Fe Fumarate-FA (PRENATAL MULTIVITAMIN) TABS tablet Take 1 tablet by mouth daily at 12 noon.   03/27/2015 at Unknown time  . cephALEXin (KEFLEX) 500 MG capsule Take 1 capsule (500 mg total) by mouth 4 (four) times daily. (Patient not taking: Reported on 01/23/2015) 20 capsule 0 Completed Course at Unknown time  . promethazine (PHENERGAN) 25 MG suppository Place 1 suppository (25 mg total) rectally every 6 (six) hours as needed for nausea or vomiting. (Patient not taking: Reported on 03/28/2015) 6 each 0 Not Taking at Unknown time    Review of Systems  Constitutional: Negative for fever and chills.  Cardiovascular: Negative for chest pain.  Gastrointestinal: Positive for abdominal pain. Negative for nausea and vomiting.  Genitourinary: Positive for dysuria, urgency and frequency. Negative for hematuria and flank pain.       Negative for vaginal discharge or bleeding.   Physical Exam   Blood pressure 100/64, pulse 84,  temperature 97.5 F (36.4 C), temperature source Oral, resp. rate 16, last menstrual period 12/18/2014.  Physical Exam  Nursing note and vitals reviewed. Constitutional: She is oriented to person, place, and time. She appears well-developed and well-nourished. No distress.  HENT:  Head: Normocephalic.  Neck: Normal range of motion.  Cardiovascular: Normal rate.   Respiratory: Effort normal.  GI: Soft. She exhibits no distension and no mass. There is no tenderness. There is no rebound, no guarding and no CVA tenderness.  Genitourinary:  Fetal heart tones by doppler at 154 BPM. Pelvic deferred-negative for vaginal bleeding or discharge.  Musculoskeletal:  + for lower back pain not flank pain  Neurological: She is alert and oriented to person, place, and time.  Skin: Skin is warm and dry.  Psychiatric: She has a normal mood and affect. Her behavior is normal.   Results for orders placed or performed during the hospital encounter of 03/28/15 (from the past 24 hour(s))  Urinalysis, Routine w reflex microscopic (not at [facility])     Status: Abnormal   Collection Time: 03/28/15  5:16 PM  Result Value Ref Range   Color, Urine YELLOW YELLOW   APPearance CLOUDY (A) CLEAR   Specific Gravity, Urine 1.025 1.005 - 1.030   pH 7.0 5.0 - 8.0   Glucose, UA NEGATIVE NEGATIVE mg/dL   Hgb urine dipstick TRACE (A) NEGATIVE   Bilirubin Urine NEGATIVE NEGATIVE  Ketones, ur 15 (A) NEGATIVE mg/dL   Protein, ur NEGATIVE NEGATIVE mg/dL   Urobilinogen, UA 0.2 0.0 - 1.0 mg/dL   Nitrite NEGATIVE NEGATIVE   Leukocytes, UA MODERATE (A) NEGATIVE  Urine microscopic-add on     Status: Abnormal   Collection Time: 03/28/15  5:16 PM  Result Value Ref Range   Squamous Epithelial / LPF MANY (A) RARE   WBC, UA 3-6 <3 WBC/hpf   Bacteria, UA MANY (A) RARE   Urine-Other AMORPHOUS URATES/PHOSPHATES    MAU Course  Procedures  Urine culture pending  MDM MSE Labs Exam  Assessment and Plan  A:  UTI      Second  trimester pregnancy  P:  Rx for Keflex to pharmacy      Encouraged her to continue vitamins and begin prenatal care as soon as possible. KEY,EVE M 03/28/2015, 5:58 PM

## 2015-03-28 NOTE — MAU Note (Signed)
Lower abd pain since last night, also dysuria for the last week.  Denies bleeding, feels like she has some type of discharge but she hasn't been to bathroom to see.  No PNC.

## 2015-03-30 LAB — CULTURE, OB URINE: SPECIAL REQUESTS: NORMAL

## 2015-05-05 LAB — OB RESULTS CONSOLE ABO/RH: RH TYPE: POSITIVE

## 2015-05-05 LAB — OB RESULTS CONSOLE RUBELLA ANTIBODY, IGM: Rubella: UNDETERMINED

## 2015-05-05 LAB — OB RESULTS CONSOLE GC/CHLAMYDIA
Chlamydia: NEGATIVE
Gonorrhea: NEGATIVE

## 2015-05-05 LAB — OB RESULTS CONSOLE RPR: RPR: NONREACTIVE

## 2015-05-05 LAB — OB RESULTS CONSOLE ANTIBODY SCREEN: ANTIBODY SCREEN: NEGATIVE

## 2015-05-05 LAB — OB RESULTS CONSOLE HIV ANTIBODY (ROUTINE TESTING): HIV: NONREACTIVE

## 2015-05-05 LAB — OB RESULTS CONSOLE HEPATITIS B SURFACE ANTIGEN: HEP B S AG: NEGATIVE

## 2015-08-04 ENCOUNTER — Inpatient Hospital Stay (HOSPITAL_COMMUNITY)
Admission: AD | Admit: 2015-08-04 | Discharge: 2015-08-04 | Disposition: A | Discharge status: Home / Self Care | Payer: Medicaid Other | Source: Ambulatory Visit | Attending: Obstetrics and Gynecology | Admitting: Obstetrics and Gynecology

## 2015-08-04 ENCOUNTER — Encounter (HOSPITAL_COMMUNITY): Payer: Self-pay | Admitting: *Deleted

## 2015-08-04 DIAGNOSIS — N760 Acute vaginitis: Secondary | ICD-10-CM | POA: Diagnosis not present

## 2015-08-04 DIAGNOSIS — D649 Anemia, unspecified: Secondary | ICD-10-CM | POA: Diagnosis not present

## 2015-08-04 DIAGNOSIS — B9689 Other specified bacterial agents as the cause of diseases classified elsewhere: Secondary | ICD-10-CM

## 2015-08-04 DIAGNOSIS — O99019 Anemia complicating pregnancy, unspecified trimester: Secondary | ICD-10-CM

## 2015-08-04 DIAGNOSIS — O23593 Infection of other part of genital tract in pregnancy, third trimester: Secondary | ICD-10-CM | POA: Diagnosis not present

## 2015-08-04 DIAGNOSIS — Z3A33 33 weeks gestation of pregnancy: Secondary | ICD-10-CM | POA: Diagnosis not present

## 2015-08-04 DIAGNOSIS — O99820 Streptococcus B carrier state complicating pregnancy: Secondary | ICD-10-CM | POA: Diagnosis not present

## 2015-08-04 DIAGNOSIS — B951 Streptococcus, group B, as the cause of diseases classified elsewhere: Secondary | ICD-10-CM

## 2015-08-04 DIAGNOSIS — O4700 False labor before 37 completed weeks of gestation, unspecified trimester: Secondary | ICD-10-CM

## 2015-08-04 DIAGNOSIS — J45909 Unspecified asthma, uncomplicated: Secondary | ICD-10-CM | POA: Diagnosis not present

## 2015-08-04 DIAGNOSIS — O4703 False labor before 37 completed weeks of gestation, third trimester: Secondary | ICD-10-CM

## 2015-08-04 DIAGNOSIS — R109 Unspecified abdominal pain: Secondary | ICD-10-CM | POA: Diagnosis present

## 2015-08-04 DIAGNOSIS — O99013 Anemia complicating pregnancy, third trimester: Secondary | ICD-10-CM | POA: Insufficient documentation

## 2015-08-04 DIAGNOSIS — O99513 Diseases of the respiratory system complicating pregnancy, third trimester: Secondary | ICD-10-CM | POA: Diagnosis not present

## 2015-08-04 HISTORY — DX: Anemia complicating pregnancy, unspecified trimester: O99.019

## 2015-08-04 LAB — URINALYSIS, ROUTINE W REFLEX MICROSCOPIC
BILIRUBIN URINE: NEGATIVE
Glucose, UA: NEGATIVE mg/dL
HGB URINE DIPSTICK: NEGATIVE
KETONES UR: NEGATIVE mg/dL
Nitrite: NEGATIVE
PROTEIN: NEGATIVE mg/dL
SPECIFIC GRAVITY, URINE: 1.015 (ref 1.005–1.030)
pH: 7.5 (ref 5.0–8.0)

## 2015-08-04 LAB — WET PREP, GENITAL
SPERM: NONE SEEN
Trich, Wet Prep: NONE SEEN
Yeast Wet Prep HPF POC: NONE SEEN

## 2015-08-04 LAB — URINE MICROSCOPIC-ADD ON

## 2015-08-04 MED ORDER — METRONIDAZOLE 500 MG PO TABS: 500.0000 mg | ORAL_TABLET | Freq: Two times a day (BID) | ORAL | Status: DC

## 2015-08-04 NOTE — MAU Provider Note (Signed)
History  19 yo G1P0 @ 33.3 wks presents unannounced to MAU w/ c/o lower abdominal cramping off and on since earlier today. + FM. Denies bleeding, leaking, vaginal itching/odor, dysuria, urgency, diarrhea or constipation. Reports Intercourse in the past 24-48 hrs. Admits to drinking one 16.9 oz bottle of water today.   Patient Active Problem List   Diagnosis Date Noted  . Anemia affecting pregnancy 08/04/2015  . Positive GBS test 08/04/2015  . BV (bacterial vaginosis) 08/04/2015  . Premature uterine contractions, antepartum 08/04/2015  . Asthma 08/04/2015    Chief Complaint  Patient presents with  . Contractions   HPI As above OB History    Gravida Para Term Preterm AB TAB SAB Ectopic Multiple Living   1               Past Medical History  Diagnosis Date  . Asthma   . Infection     Past Surgical History  Procedure Laterality Date  . No past surgeries      History reviewed. No pertinent family history.  Social History  Substance Use Topics  . Smoking status: Never Smoker   . Smokeless tobacco: None  . Alcohol Use: No    Allergies: No Known Allergies  No prescriptions prior to admission    ROS  +FM -VB -LOF +Cramping Physical Exam   Blood pressure 109/52, pulse 99, temperature 97.5 F (36.4 C), resp. rate 18, last menstrual period 12/18/2014. Results for orders placed or performed during the hospital encounter of 08/04/15 (from the past 24 hour(s))  Urinalysis, Routine w reflex microscopic (not at [facility])     Status: Abnormal   Collection Time: 08/04/15  6:40 PM  Result Value Ref Range   Color, Urine YELLOW YELLOW   APPearance HAZY (A) CLEAR   Specific Gravity, Urine 1.015 1.005 - 1.030   pH 7.5 5.0 - 8.0   Glucose, UA NEGATIVE NEGATIVE mg/dL   Hgb urine dipstick NEGATIVE NEGATIVE   Bilirubin Urine NEGATIVE NEGATIVE   Ketones, ur NEGATIVE NEGATIVE mg/dL   Protein, ur NEGATIVE NEGATIVE mg/dL   Nitrite NEGATIVE NEGATIVE   Leukocytes, UA SMALL (A)  NEGATIVE  Urine microscopic-add on     Status: Abnormal   Collection Time: 08/04/15  6:40 PM  Result Value Ref Range   Squamous Epithelial / LPF 0-5 (A) NONE SEEN   WBC, UA 0-5 0 - 5 WBC/hpf   RBC / HPF 0-5 0 - 5 RBC/hpf   Bacteria, UA RARE (A) NONE SEEN  Wet prep, genital     Status: Abnormal   Collection Time: 08/04/15  7:45 PM  Result Value Ref Range   Yeast Wet Prep HPF POC NONE SEEN NONE SEEN   Trich, Wet Prep NONE SEEN NONE SEEN   Clue Cells Wet Prep HPF POC PRESENT (A) NONE SEEN   WBC, Wet Prep HPF POC FEW (A) NONE SEEN   Sperm NONE SEEN      Physical Exam Gen: NAD Abdomen: gravid, soft, NT, no guarding or rebound NEFG Vagina: No lesions. No blood in vault fFN deferred due to recent intercourse Speculum: Moderate amount of white milky discharge present, no odor. Sample collected for wet prep and GC/CT. Cvx visually and digitally closed FHRT: BL 135 w/ moderate variability, abundant accels, no decels Toco: Irritability initially, followed by irregular ctxs, then none ED Course  UA Urine culture Wet prep GC/CT Assessment: PTCs BV  Plan: Prolonged monitoring due to ctxs - uterine irritability/ctxs quiesced after oral hydration Metronidazole 7 day  course Discussed in length the importance of hydration. Feel ctxs are also a result of BV and intercourse. Advised to take medication as prescribed and refrain from intercourse during treatment Recommended frequent rest periods PTL precautions F/U on cultures OB f/u as scheduled    Sherre ScarletWILLIAMS, Samariyah Cowles CNM, MS 08/04/2015 9:57 PM

## 2015-08-04 NOTE — Discharge Instructions (Signed)

## 2015-08-04 NOTE — MAU Note (Signed)
Pt reports having lowing abd pain and cramping off and on this afternoon. Stated she was told her cervix is dilated to 1-2 cm. Denies vag discharge or bleeding.

## 2015-08-05 LAB — GC/CHLAMYDIA PROBE AMP (~~LOC~~) NOT AT ARMC
Chlamydia: NEGATIVE
Neisseria Gonorrhea: NEGATIVE

## 2015-08-07 LAB — CULTURE, OB URINE

## 2015-09-04 NOTE — L&D Delivery Note (Signed)
Final Labor Progress Note At 0800 pt reports an increased in rectal pressure.  VE 9/90/-1.  FHR remained reassuring   Vaginal Delivery Note The pt utilized an epidural as pain management.   Spontaneous rupture of membranes today, at 0905, light meconium.  GBS was positive, PCN x 3 doses were given.  Cervical dilation was complete at  1045.     Pushing with guidance began at  1055.   After 16 minutes of pushing the head, shoulders and the body of a viable girl infant "Kaitlyn Hardin" delivered spontaneously with maternal effort in the LOT position at 1111.    With vigorous tone and spontaneous cry, the infant was placed on moms abd.  After the umbilical cord was clamped it was cut by the FOB, then cord blood was obtained for evaluation.  Spontaneous delivery of a meconium stained intact placenta with a 3 vessel cord via Shultz at 1129.   Episiotomy: None   The vulva, perineum, vaginal vault, rectum and cervix were inspected and revealed a 2nd degree perineal, repaired using a 3-0 vicryl on a CT needle.  Lidocaine was not used, the epidural was sufficient for the repair.   Patient tolerated repair well.   Postpartum pitocin as ordered.  Fundus firm, lochia minimum, bleeding under control.   EBL 150, QBL pending, Pt hemodynamically stable.   Sponge, laps and needle count correct and verified with the primary care nurse.  Attending MD available at all times.    Routine postpartum orders   Mother unsure about method of contraception  Mom plans to breastfeed   Placenta to pathology: YES    Cord Gases sent to lab: NO Cord blood sent to lab: YES   APGARS:  9 at 1 minute and 9 at 5 minutes Weight:. pending    Both mom and baby were left in stable condition, baby skin to skin.      Georgana Romain, CNM, MSN 09/14/2015. 12:16 PM

## 2015-09-13 ENCOUNTER — Inpatient Hospital Stay (HOSPITAL_COMMUNITY)
Admission: AD | Admit: 2015-09-13 | Discharge: 2015-09-16 | DRG: 775 | Disposition: A | Discharge status: Home / Self Care | Payer: Medicaid Other | Source: Ambulatory Visit | Attending: Obstetrics and Gynecology | Admitting: Obstetrics and Gynecology

## 2015-09-13 ENCOUNTER — Encounter (HOSPITAL_COMMUNITY): Payer: Self-pay

## 2015-09-13 DIAGNOSIS — D649 Anemia, unspecified: Secondary | ICD-10-CM | POA: Diagnosis present

## 2015-09-13 DIAGNOSIS — O9952 Diseases of the respiratory system complicating childbirth: Secondary | ICD-10-CM | POA: Diagnosis present

## 2015-09-13 DIAGNOSIS — Z3A39 39 weeks gestation of pregnancy: Secondary | ICD-10-CM

## 2015-09-13 DIAGNOSIS — O99824 Streptococcus B carrier state complicating childbirth: Secondary | ICD-10-CM | POA: Diagnosis present

## 2015-09-13 DIAGNOSIS — O9902 Anemia complicating childbirth: Secondary | ICD-10-CM | POA: Diagnosis present

## 2015-09-13 DIAGNOSIS — IMO0001 Reserved for inherently not codable concepts without codable children: Secondary | ICD-10-CM

## 2015-09-13 DIAGNOSIS — J45909 Unspecified asthma, uncomplicated: Secondary | ICD-10-CM | POA: Diagnosis present

## 2015-09-13 NOTE — MAU Note (Signed)
Pt c/o contractions every 5 mins. Denies LOF or vag bleeding. +FM. Was 2cm today

## 2015-09-14 ENCOUNTER — Inpatient Hospital Stay (HOSPITAL_COMMUNITY): Payer: Medicaid Other | Admitting: Anesthesiology

## 2015-09-14 ENCOUNTER — Encounter (HOSPITAL_COMMUNITY): Payer: Self-pay

## 2015-09-14 DIAGNOSIS — O99824 Streptococcus B carrier state complicating childbirth: Secondary | ICD-10-CM | POA: Diagnosis present

## 2015-09-14 DIAGNOSIS — O9952 Diseases of the respiratory system complicating childbirth: Secondary | ICD-10-CM | POA: Diagnosis present

## 2015-09-14 DIAGNOSIS — IMO0001 Reserved for inherently not codable concepts without codable children: Secondary | ICD-10-CM

## 2015-09-14 DIAGNOSIS — O9902 Anemia complicating childbirth: Secondary | ICD-10-CM | POA: Diagnosis present

## 2015-09-14 DIAGNOSIS — D649 Anemia, unspecified: Secondary | ICD-10-CM | POA: Diagnosis present

## 2015-09-14 DIAGNOSIS — J45909 Unspecified asthma, uncomplicated: Secondary | ICD-10-CM | POA: Diagnosis present

## 2015-09-14 DIAGNOSIS — Z3A39 39 weeks gestation of pregnancy: Secondary | ICD-10-CM | POA: Diagnosis not present

## 2015-09-14 LAB — RAPID HIV SCREEN (HIV 1/2 AB+AG)
HIV 1/2 ANTIBODIES: NONREACTIVE
HIV-1 P24 ANTIGEN - HIV24: NONREACTIVE

## 2015-09-14 LAB — TYPE AND SCREEN
ABO/RH(D): B POS
Antibody Screen: NEGATIVE

## 2015-09-14 LAB — PROTEIN / CREATININE RATIO, URINE
Creatinine, Urine: 17 mg/dL
Total Protein, Urine: <6 mg/dL

## 2015-09-14 LAB — CBC
HCT: 33.1 % — ABNORMAL LOW (ref 36.0–46.0)
Hemoglobin: 11.2 g/dL — ABNORMAL LOW (ref 12.0–15.0)
MCH: 29.6 pg (ref 26.0–34.0)
MCHC: 33.8 g/dL (ref 30.0–36.0)
MCV: 87.6 fL (ref 78.0–100.0)
PLATELETS: 203 10*3/uL (ref 150–400)
RBC: 3.78 MIL/uL — ABNORMAL LOW (ref 3.87–5.11)
RDW: 12.4 % (ref 11.5–15.5)
WBC: 11.7 10*3/uL — ABNORMAL HIGH (ref 4.0–10.5)

## 2015-09-14 LAB — RPR: RPR: NONREACTIVE

## 2015-09-14 LAB — ABO/RH: ABO/RH(D): B POS

## 2015-09-14 MED ORDER — LACTATED RINGERS IV SOLN
500.0000 mL | INTRAVENOUS | Status: DC | PRN
  Administered 2015-09-14: 500 mL via INTRAVENOUS

## 2015-09-14 MED ORDER — DIBUCAINE 1 % RE OINT
1.0000 | TOPICAL_OINTMENT | RECTAL | Status: DC | PRN
Start: 2015-09-14 — End: 2015-09-16

## 2015-09-14 MED ORDER — OXYTOCIN BOLUS FROM INFUSION
500.0000 mL | INTRAVENOUS | Status: DC
  Administered 2015-09-14: 500 mL via INTRAVENOUS

## 2015-09-14 MED ORDER — IBUPROFEN 600 MG PO TABS
600.0000 mg | ORAL_TABLET | Freq: Four times a day (QID) | ORAL | Status: DC
  Administered 2015-09-14 – 2015-09-16 (×7): 600 mg via ORAL
  Filled 2015-09-14 (×7): qty 1

## 2015-09-14 MED ORDER — DIPHENHYDRAMINE HCL 50 MG/ML IJ SOLN: 12.5000 mg | INTRAMUSCULAR | Status: DC | PRN

## 2015-09-14 MED ORDER — CITRIC ACID-SODIUM CITRATE 334-500 MG/5ML PO SOLN: 30.0000 mL | ORAL | Status: DC | PRN

## 2015-09-14 MED ORDER — WITCH HAZEL-GLYCERIN EX PADS: 1.0000 "application " | MEDICATED_PAD | CUTANEOUS | Status: DC | PRN

## 2015-09-14 MED ORDER — LANOLIN HYDROUS EX OINT: TOPICAL_OINTMENT | CUTANEOUS | Status: DC | PRN

## 2015-09-14 MED ORDER — PENICILLIN G POTASSIUM 5000000 UNITS IJ SOLR
2.5000 10*6.[IU] | INTRAVENOUS | Status: DC
  Administered 2015-09-14 (×2): 2.5 10*6.[IU] via INTRAVENOUS
  Filled 2015-09-14 (×7): qty 2.5

## 2015-09-14 MED ORDER — ONDANSETRON HCL 4 MG/2ML IJ SOLN
4.0000 mg | INTRAMUSCULAR | Status: DC | PRN
  Administered 2015-09-14: 4 mg via INTRAVENOUS
  Filled 2015-09-14: qty 2

## 2015-09-14 MED ORDER — OXYCODONE-ACETAMINOPHEN 5-325 MG PO TABS: 2.0000 | ORAL_TABLET | ORAL | Status: DC | PRN

## 2015-09-14 MED ORDER — FENTANYL CITRATE (PF) 100 MCG/2ML IJ SOLN
50.0000 ug | INTRAMUSCULAR | Status: DC | PRN
Start: 2015-09-14 — End: 2015-09-14

## 2015-09-14 MED ORDER — ACETAMINOPHEN 325 MG PO TABS: 650.0000 mg | ORAL_TABLET | ORAL | Status: DC | PRN

## 2015-09-14 MED ORDER — TETANUS-DIPHTH-ACELL PERTUSSIS 5-2.5-18.5 LF-MCG/0.5 IM SUSP: 0.5000 mL | Freq: Once | INTRAMUSCULAR | Status: DC

## 2015-09-14 MED ORDER — BENZOCAINE-MENTHOL 20-0.5 % EX AERO
1.0000 "application " | INHALATION_SPRAY | CUTANEOUS | Status: DC | PRN
  Filled 2015-09-14: qty 56

## 2015-09-14 MED ORDER — OXYTOCIN 10 UNIT/ML IJ SOLN
2.5000 [IU]/h | INTRAVENOUS | Status: DC
  Filled 2015-09-14: qty 4

## 2015-09-14 MED ORDER — ZOLPIDEM TARTRATE 5 MG PO TABS: 5.0000 mg | ORAL_TABLET | Freq: Every evening | ORAL | Status: DC | PRN

## 2015-09-14 MED ORDER — EPHEDRINE 5 MG/ML INJ
10.0000 mg | INTRAVENOUS | Status: DC | PRN
  Filled 2015-09-14: qty 2

## 2015-09-14 MED ORDER — ONDANSETRON HCL 4 MG PO TABS: 4.0000 mg | ORAL_TABLET | ORAL | Status: DC | PRN

## 2015-09-14 MED ORDER — ONDANSETRON HCL 4 MG/2ML IJ SOLN: 4.0000 mg | Freq: Four times a day (QID) | INTRAMUSCULAR | Status: DC | PRN

## 2015-09-14 MED ORDER — SENNOSIDES-DOCUSATE SODIUM 8.6-50 MG PO TABS
2.0000 | ORAL_TABLET | ORAL | Status: DC
  Administered 2015-09-15 (×2): 2 via ORAL
  Filled 2015-09-14 (×2): qty 2

## 2015-09-14 MED ORDER — PHENYLEPHRINE 40 MCG/ML (10ML) SYRINGE FOR IV PUSH (FOR BLOOD PRESSURE SUPPORT)
80.0000 ug | PREFILLED_SYRINGE | INTRAVENOUS | Status: DC | PRN
  Filled 2015-09-14: qty 2
  Filled 2015-09-14: qty 20

## 2015-09-14 MED ORDER — PRENATAL MULTIVITAMIN CH
1.0000 | ORAL_TABLET | Freq: Every day | ORAL | Status: DC
  Administered 2015-09-15: 1 via ORAL
  Filled 2015-09-14: qty 1

## 2015-09-14 MED ORDER — LIDOCAINE HCL (PF) 1 % IJ SOLN
30.0000 mL | INTRAMUSCULAR | Status: DC | PRN
  Filled 2015-09-14: qty 30

## 2015-09-14 MED ORDER — LACTATED RINGERS IV SOLN
INTRAVENOUS | Status: DC
  Administered 2015-09-14: 02:00:00 via INTRAVENOUS
  Administered 2015-09-14: 125 mL/h via INTRAVENOUS

## 2015-09-14 MED ORDER — LIDOCAINE HCL (PF) 1 % IJ SOLN
INTRAMUSCULAR | Status: DC | PRN
  Administered 2015-09-14: 6 mL via EPIDURAL
  Administered 2015-09-14: 8 mL via EPIDURAL

## 2015-09-14 MED ORDER — OXYCODONE-ACETAMINOPHEN 5-325 MG PO TABS
1.0000 | ORAL_TABLET | ORAL | Status: DC | PRN
Start: 2015-09-14 — End: 2015-09-14

## 2015-09-14 MED ORDER — DIPHENHYDRAMINE HCL 25 MG PO CAPS: 25.0000 mg | ORAL_CAPSULE | Freq: Four times a day (QID) | ORAL | Status: DC | PRN

## 2015-09-14 MED ORDER — FENTANYL 2.5 MCG/ML BUPIVACAINE 1/10 % EPIDURAL INFUSION (WH - ANES)
14.0000 mL/h | INTRAMUSCULAR | Status: DC | PRN
  Administered 2015-09-14 (×2): 14 mL/h via EPIDURAL
  Filled 2015-09-14 (×2): qty 125

## 2015-09-14 MED ORDER — PENICILLIN G POTASSIUM 5000000 UNITS IJ SOLR
5.0000 10*6.[IU] | Freq: Once | INTRAVENOUS | Status: AC
  Administered 2015-09-14: 5 10*6.[IU] via INTRAVENOUS
  Filled 2015-09-14: qty 5

## 2015-09-14 MED ORDER — SIMETHICONE 80 MG PO CHEW: 80.0000 mg | CHEWABLE_TABLET | ORAL | Status: DC | PRN

## 2015-09-14 NOTE — Anesthesia Procedure Notes (Signed)
Epidural Patient location during procedure: OB Start time: 09/14/2015 2:02 AM End time: 09/14/2015 2:06 AM  Staffing Anesthesiologist: Leilani AbleHATCHETT, Hawke Villalpando Performed by: anesthesiologist   Preanesthetic Checklist Completed: patient identified, surgical consent, pre-op evaluation, timeout performed, IV checked, risks and benefits discussed and monitors and equipment checked  Epidural Patient position: sitting Prep: site prepped and draped and DuraPrep Patient monitoring: continuous pulse ox and blood pressure Approach: midline Location: L3-L4 Injection technique: LOR air  Needle:  Needle type: Tuohy  Needle gauge: 17 G Needle length: 9 cm and 9 Needle insertion depth: 5 cm cm Catheter type: closed end flexible Catheter size: 19 Gauge Catheter at skin depth: 10 cm Test dose: negative and Other  Assessment Sensory level: T9 Events: blood not aspirated, injection not painful, no injection resistance, negative IV test and no paresthesia  Additional Notes Reason for block:procedure for pain

## 2015-09-14 NOTE — Progress Notes (Signed)
Kaitlyn Hardin MRN: [id number]  Subjective: -Patient resting in bed.  Reports comfort with epidural.  Denies q/c at current.  Further denies headache, visual disturbances, SOB, and chest pain.  Family at bedside.   Objective: BP 125/87 mmHg  Pulse 75  Temp(Src) 98.1 F (36.7 C) (Oral)  Resp 16  Ht 4\' 11"  (1.499 m)  Wt 58.514 kg (129 lb)  BMI 26.04 kg/m2  SpO2 100%  LMP 12/18/2014 (Approximate)      Fetal Monitoring: FHT: 145 bpm, Mod Var, -Decels, +Accels UC: Q1-73min, palpates moderate    Vaginal Exam: SVE:   Dilation: 6 Effacement (%): 90 Station: -2 Exam by:: Kaitlyn Hardin, CNM Membranes: Bulging Internal Monitors: None  Augmentation/Induction: Pitocin:None Cytotec: None  Assessment:  IUP at 39.2wks Cat I FT  Active Labor GBS Positive Elevated BP  Plan: -Will perform PC Ratio, if abnormal will add on additional PIH Labs -Consider amniotomy at next vaginal exam if necessary -Encouraged to rest -Continue other mgmt as ordered   [name],MSN, CNM 09/14/2015, 4:17 AM

## 2015-09-14 NOTE — Anesthesia Preprocedure Evaluation (Addendum)
Anesthesia Evaluation  Patient identified by MRN, date of birth, ID band Patient awake    Reviewed: Allergy & Precautions, H&P , NPO status , Patient's Chart, lab work & pertinent test results  Airway Mallampati: I  TM Distance: >3 FB Neck ROM: full    Dental no notable dental hx. (+) Dental Advisory Given, Teeth Intact   Pulmonary    Pulmonary exam normal breath sounds clear to auscultation       Cardiovascular negative cardio ROS   Rhythm:Regular     Neuro/Psych negative neurological ROS  negative psych ROS   GI/Hepatic negative GI ROS, Neg liver ROS,   Endo/Other  negative endocrine ROS  Renal/GU negative Renal ROS  negative genitourinary   Musculoskeletal negative musculoskeletal ROS (+)   Abdominal Normal abdominal exam  (+)   Peds negative pediatric ROS (+)  Hematology   Anesthesia Other Findings   Reproductive/Obstetrics (+) Pregnancy                            Anesthesia Physical Anesthesia Plan  ASA: II  Anesthesia Plan: Epidural   Post-op Pain Management:    Induction:   Airway Management Planned:   Additional Equipment:   Intra-op Plan:   Post-operative Plan:   Informed Consent: I have reviewed the patients History and Physical, chart, labs and discussed the procedure including the risks, benefits and alternatives for the proposed anesthesia with the patient or authorized representative who has indicated his/her understanding and acceptance.     Plan Discussed with:   Anesthesia Plan Comments:         Anesthesia Quick Evaluation

## 2015-09-14 NOTE — H&P (Signed)
Kaitlyn Hardin is a 20 y.o. female, G1P0 at 39.2 weeks, presenting for contractions.  Patient states she started contracting around 1500, but they worsened around 2100.  Patient also reports intermittent LOF that also started at 1500.  Patient pregnancy history significant for late to [facility], mild anemia (10.1 at 32wks), and GBS positive status. Patient desires epidural and FOB, Freddy, is at the bedside. Per [facility] chart, patient took CB classes.   Patient Active Problem List   Diagnosis Date Noted  . Anemia affecting pregnancy 08/04/2015  . Positive GBS test 08/04/2015  . BV (bacterial vaginosis) 08/04/2015  . Premature uterine contractions, antepartum 08/04/2015  . Asthma 08/04/2015    History of present pregnancy: Patient entered care at 23.1 weeks.   EDC of 09/19/2015 was established by LMP and confirmed by 24.2wk [place] on 05/30/2015.   Anatomy scan:  23 weeks, with normal findings.   Additional [place] evaluations:  -Anatomy: NORMAL ANTOMY BUT NEED COMPLETION IN 2 WEEKS -26.1wks: [place] : SIUP, VEREX, NORMAL FLUID, NORMAL ANATOMY, ANATOMY NOW COMPLETE, CX 2.91 CM, GROWTH 50% Significant prenatal events: 1st Trimester: N/A 2nd Trimester: Late to [facility] at 23wks.   3rd Trimester:   Patient c/o clear vaginal discharge. Patient started iron supplementation.  Patient seen in MAU for intermittent abdominal cramping and treated for BV. Last evaluation:  09/13/2015 in office by S.Devane-Johnson, CNM SVE: 2cm / 80% / -1 BP:98/58  Wt: 129.5 lbs  TWG: 22.5lbs  OB History    Gravida Para Term Preterm AB TAB SAB Ectopic Multiple Living   1              Past Medical History  Diagnosis Date  . Asthma   . Infection    Past Surgical History  Procedure Laterality Date  . No past surgeries     Family History: family history is not on file. Social History:  reports that she has never smoked. She does not have any smokeless tobacco history on file. She reports that she does not drink alcohol or use illicit  drugs.   Prenatal Transfer Tool  Maternal Diabetes: No Genetic Screening: Normal Maternal Ultrasounds/Referrals: Normal Fetal Ultrasounds or other Referrals:  None Maternal Substance Abuse:  No Significant Maternal Medications:  None Significant Maternal Lab Results: Lab values include: Group B Strep positive    ROS:  +Ctx, +LoF, -VB  Patient denies recent illness and current GI symptoms.  No Known Allergies   Dilation: 3.5 Effacement (%): 90 Station: -2 Exam by:: D Simpson RN Blood pressure 116/74, pulse 80, temperature 97.4 F (36.3 C), temperature source Oral, resp. rate 18, height 4' 11"  (1.499 m), weight 58.514 kg (129 lb), last menstrual period 12/18/2014, SpO2 98 %.  Physical Exam  Constitutional: She is oriented to person, place, and time. She appears well-developed and well-nourished. She appears distressed (During contractions).  HENT:  Head: Normocephalic and atraumatic.  Eyes: EOM are normal.  Neck: Normal range of motion.  Cardiovascular: Normal rate, regular rhythm and normal heart sounds.   Respiratory: Effort normal and breath sounds normal.  GI: Soft. Bowel sounds are normal.  Gravid--fundal height appears AGA, Soft, NT   Musculoskeletal: Normal range of motion. She exhibits no edema.  Neurological: She is alert and oriented to person, place, and time.  Skin: Skin is warm and dry.    Leopolds: EFW: 6-6 1/4lbs Presentation: Vertex   FHR: 145 bpm, Mod Var, -Decels, +Accels UCs:  Q1-38mn, palpates moderate  Prenatal labs: ABO, Rh: B/Positive/-- (09/01 0000) Antibody: Negative (  09/01 0000) Rubella:  Equivocal RPR: Nonreactive (09/01 0000)  HBsAg: Negative (09/01 0000)  HIV: Non-reactive (09/01 0000)  GBS:  Positive Sickle cell/Hgb electrophoresis:  Normal Pap:  LSIL 05/24/2015 GC:  Negative Chlamydia:  Negative Other:  None    Assessment IUP at 39.2wks Cat I FT Active Labor GBS Positive Rubella Equivocal   Plan: Admit to [employer]  Routine Labor and Delivery Orders per [facility] Protocol In room to complete assessment and discuss POC: Okay for epidural PCN for GBS prophylaxis Offer MMR vaccine in [facility] Dr.EK to be updated as appropriate  Loann Quill, MSN 09/14/2015, 12:14 AM

## 2015-09-14 NOTE — Anesthesia Postprocedure Evaluation (Signed)
Anesthesia Post Note  Patient: Kaitlyn Hardin  Procedure(s) Performed: * No procedures listed *  Patient location during evaluation: Mother Baby Anesthesia Type: Epidural Level of consciousness: awake and alert Pain management: satisfactory to patient Vital Signs Assessment: post-procedure vital signs reviewed and stable Respiratory status: respiratory function stable Cardiovascular status: stable Postop Assessment: no headache, no backache, epidural receding, patient able to bend at knees, no signs of nausea or vomiting and adequate PO intake Anesthetic complications: no    Last Vitals:  Filed Vitals:   09/14/15 1315 09/14/15 1415  BP: 118/86 100/65  Pulse: 78 99  Temp: 36.7 C 37.3 C  Resp: 16 16    Last Pain:  Filed Vitals:   09/14/15 1506  PainSc: 0-No pain                 [name]

## 2015-09-14 NOTE — Lactation Note (Signed)
This note was copied from the chart of [facility]. Lactation Consultation Note  Patient Name: Kaitlyn Letta MedianDulce Gali Hardin's Date: 09/14/2015 Reason for consult: Initial assessment Baby at 6 hr of life and mom reports that her R nipple is sore from feeding the baby. Both nipples appear normal, no skin break down, redness, or bruising noted. Baby was able to easily latch to the L breast. Demonstrated to mom how to wait for the wide open mouth and guide baby on the compressed nipple. She stated that it was painful, like baby was biting. Took baby off the breast, nipple appeared normal, no compression stripes nor was the nipple misshapen in any way. Had baby suck on a gloved finger. Baby had good tongue movement, no gumming felt, and flanged lips. Palate felt normal, sucking reflex was normal, no gagging noted. Put baby back to breast and mom still reported pain. Offered to take baby off and try a different position and mom declined. She stated that she thinks the biting she is feeling is normal. Encouraged mom to call for latch help as needed and keep bf on demand. Discussed baby behavior, feeding frequency, baby belly size, voids, wt loss, breast changes, supplementing, pumping, and nipple care. Given lactation handouts. She is aware of OP services and support group.       Maternal Data Has patient been taught Hand Expression?: Yes Does the patient have breastfeeding experience prior to this delivery?: No  Feeding Feeding Type: Breast Fed Length of feed: 10 min  LATCH Score/Interventions Latch: Grasps breast easily, tongue down, lips flanged, rhythmical sucking.  Audible Swallowing: Spontaneous and intermittent  Type of Nipple: Everted at rest and after stimulation  Comfort (Breast/Nipple): Filling, red/small blisters or bruises, mild/mod discomfort  Problem noted: Mild/Moderate discomfort Interventions (Mild/moderate discomfort): Hand expression  Hold (Positioning): Full assist, staff  holds infant at breast Intervention(s): Position options;Support Pillows  LATCH Score: 7  Lactation Tools Discussed/Used WIC Program: Yes [facility]([place]   Consult Status Consult Status: Follow-up Date: 09/15/15 Follow-up type: In-patient    Rulon Eisenmengerlizabeth E Abdulloh Ullom 09/14/2015, 6:00 PM

## 2015-09-15 LAB — CBC
HCT: 23.1 % — ABNORMAL LOW (ref 36.0–46.0)
Hemoglobin: 7.8 g/dL — ABNORMAL LOW (ref 12.0–15.0)
MCH: 29.8 pg (ref 26.0–34.0)
MCHC: 33.8 g/dL (ref 30.0–36.0)
MCV: 88.2 fL (ref 78.0–100.0)
PLATELETS: 152 10*3/uL (ref 150–400)
RBC: 2.62 MIL/uL — ABNORMAL LOW (ref 3.87–5.11)
RDW: 12.7 % (ref 11.5–15.5)
WBC: 11.6 10*3/uL — ABNORMAL HIGH (ref 4.0–10.5)

## 2015-09-15 MED ORDER — FERROUS SULFATE 325 (65 FE) MG PO TABS
325.0000 mg | ORAL_TABLET | Freq: Two times a day (BID) | ORAL | Status: DC
  Administered 2015-09-15 (×2): 325 mg via ORAL
  Filled 2015-09-15 (×2): qty 1

## 2015-09-15 NOTE — Lactation Note (Signed)
This note was copied from the chart of [facility]. Lactation Consultation Note  Patient Name: Kaitlyn Letta MedianDulce Hardin [id number]'s Date: 09/15/2015 Reason for consult: Follow-up assessment   Follow up with first time mom of 29 hour old infant. Infant with 6 BF for 10 minutes, 3 bottle feedings of formula of 15-30 cc, 2 voids and 2 stools in last 24 hours. Infant weight 7 lb 0.9 oz with a 1 % weight loss since birth. Mom reports she has some nipple tenderness with initial latch that improves with feeding. Last 3 feedings have been bottle feeds, Mom says she does plan to BF, encouraged her to BF first before giving formula to stimulate milk supply to come in . Mom denies questions at this time. Infant is currently asleep. [facility]    Maternal Data    Feeding    LATCH Score/Interventions                      Lactation Tools Discussed/Used WIC Program: Yes   Consult Status Consult Status: Follow-up Date: 09/16/15 Follow-up type: In-patient    Kaitlyn FloodSharon S Hice 09/15/2015, 4:48 PM

## 2015-09-15 NOTE — Progress Notes (Signed)
AT&TDulce Hardin  Post Partum Day 1:S/P SVD with repair of 2nd Degree Perineal Laceration  Subjective: Patient up ad lib, denies syncope or dizziness. Reports consuming regular diet without issues and denies N/V. Denies issues with urination and feels that bleeding is "good."  Patient is breast and bottlefeeding and reports going well, but expresses that breastfeeding is hard. Patient unsure of desires for postpartum contraception.  Pain is being appropriately managed with use of motrin.  Objective: Filed Vitals:   09/14/15 1415 09/14/15 1830 09/15/15 0130 09/15/15 0548  BP: 100/65 111/66 93/48 95/51   Pulse: 99 76 76 71  Temp: 99.1 F (37.3 C) 98.5 F (36.9 C) 97.7 F (36.5 C) 97.9 F (36.6 C)  TempSrc: Oral Oral Oral Oral  Resp: 16 18 18 18   Height:      Weight:      SpO2:   98%     Recent Labs  09/14/15 0015 09/15/15 0530  HGB 11.2* 7.8*  HCT 33.1* 23.1*    Physical Exam:  General: alert, cooperative, no distress and pale Mood/Affect: Appropriate/Bright Lungs: clear to auscultation, no wheezes, rales or rhonchi, symmetric air entry.  Heart: normal rate and regular rhythm. Breast: not examined. Abdomen:  + bowel sounds, Soft, Appropriately Tender at Fundus Uterine Fundus: firm, U/-2 Lochia: appropriate Laceration: Healing Well, Ice pack in place Skin: Warm, Dry DVT Evaluation: No evidence of DVT seen on physical exam. No cords or calf tenderness. No significant calf/ankle edema.  Assessment S/P Vaginal Delivery-Day 1 Normal Involution Breast/BottleFeeding Asymptomatic Anemia  Plan: Discussed HgB level and associated s/s Discussed availability of blood transfusion, even in outpatient setting -Orthostatic Vital Signs -Fe+ supplementation 325mg  BID -CBC in am to reassess Given information regarding BC methods via [email or web address]  Continue current care Dr. Lynford HumphreySR to be updated on patient status   Cherre RobinsJessica L Tashonda Pinkus, MSN, CNM 09/15/2015, 8:04 AM

## 2015-09-16 LAB — CBC
HCT: 25.4 % — ABNORMAL LOW (ref 36.0–46.0)
HEMOGLOBIN: 8.4 g/dL — AB (ref 12.0–15.0)
MCH: 29.8 pg (ref 26.0–34.0)
MCHC: 33.1 g/dL (ref 30.0–36.0)
MCV: 90.1 fL (ref 78.0–100.0)
Platelets: 182 10*3/uL (ref 150–400)
RBC: 2.82 MIL/uL — AB (ref 3.87–5.11)
RDW: 12.9 % (ref 11.5–15.5)
WBC: 11.4 10*3/uL — ABNORMAL HIGH (ref 4.0–10.5)

## 2015-09-16 MED ORDER — IBUPROFEN 600 MG PO TABS: 600.0000 mg | ORAL_TABLET | Freq: Four times a day (QID) | ORAL | Status: DC

## 2015-09-16 MED ORDER — MEASLES, MUMPS & RUBELLA VAC ~~LOC~~ INJ
0.5000 mL | INJECTION | Freq: Once | SUBCUTANEOUS | Status: AC
  Administered 2015-09-16: 0.5 mL via SUBCUTANEOUS
  Filled 2015-09-16: qty 0.5

## 2015-09-16 MED ORDER — FERROUS SULFATE 325 (65 FE) MG PO TABS: 325.0000 mg | ORAL_TABLET | Freq: Two times a day (BID) | ORAL | Status: DC

## 2015-09-16 NOTE — Lactation Note (Signed)
This note was copied from the chart of [facility]. Lactation Consultation Note Mom sitting in recliner had just BF baby. Mom stated nipples slightly sore, noted positional stripes. Comfort gels given. nipples w/bouncey areolas, cone shaped breast. Hand expression demonstrated and noted colostrum, reminded of colostrum to nipples for healing. Gave shells for soreness and evert nipples more. Compressible. Has WIC. Gave hand pump. Discussed engorgement and depleating supply by doing breast and bottle.  Discussed positioning and latching. Patient Name: Kaitlyn Letta MedianDulce Hardin [id number]'s Date: 09/16/2015 Reason for consult: Follow-up assessment   Maternal Data    Feeding Feeding Type: Bottle Fed - Formula Nipple Type: Slow - flow  LATCH Score/Interventions       Type of Nipple: Everted at rest and after stimulation  Comfort (Breast/Nipple): Filling, red/small blisters or bruises, mild/mod discomfort  Problem noted: Mild/Moderate discomfort Interventions (Mild/moderate discomfort): Comfort gels;Pre-pump if needed;Hand expression;Hand massage  Intervention(s): Skin to skin;Position options;Breastfeeding basics reviewed;Support Pillows     Lactation Tools Discussed/Used Tools: Shells;Pump;Comfort gels Shell Type: Inverted Breast pump type: Manual Pump Review: Setup, frequency, and cleaning;Milk Storage Initiated by:: Peri JeffersonL. Kaitlyn Youngblood RN Date initiated:: 09/16/15   Consult Status Consult Status: Complete Date: 09/16/15    Kaitlyn Hardin, Kaitlyn Hardin 09/16/2015, 6:33 AM

## 2015-09-16 NOTE — Discharge Instructions (Signed)
Pelvic Rest HOME CARE INSTRUCTIONS Do not have sexual intercourse, stimulation, or an orgasm. Do not use tampons, douche, or put anything in the vagina. Do not lift anything over 10 pounds (4.5 kg). Avoid strenuous activity or straining your pelvic muscles. SEEK MEDICAL CARE IF: You have heavy bleeding saturating more than a pad an hour Passing blood clots bigger than a golf ball Abnormal smell or discharge from the vagina  Postpartum Depression and Baby Blues The postpartum period begins right after the birth of a baby. During this time, there is often a great amount of joy and excitement. It is also a time of many changes in the life of the parents. Regardless of how many times a mother gives birth, each child brings new challenges and dynamics to the family. It is not unusual to have feelings of excitement along with confusing shifts in moods, emotions, and thoughts. All mothers are at risk of developing postpartum depression or the "baby blues." These mood changes can occur right after giving birth, or they may occur many months after giving birth. The baby blues or postpartum depression can be mild or severe. Additionally, postpartum depression can go away rather quickly, or it can be a long-term condition.   CAUSES Raised hormone levels and the rapid drop in those levels are thought to be a main cause of postpartum depression and the baby blues. A number of hormones change during and after pregnancy. Estrogen and progesterone usually decrease right after the delivery of your baby. The levels of thyroid hormone and various cortisol steroids also rapidly drop. Other factors that play a role in these mood changes include major life events and genetics.   RISK FACTORS If you have any of the following risks for the baby blues or postpartum depression, know what symptoms to watch out for during the postpartum period. Risk factors that may increase the likelihood of getting the baby blues or  postpartum depression include: Having a personal or family history of depression.  Having depression while being pregnant.  Having premenstrual mood issues or mood issues related to oral contraceptives. Having a lot of life stress.  Having marital conflict.  Lacking a social support network.  Having a baby with special needs.  Having health problems, such as diabetes.   SIGNS AND SYMPTOMS Symptoms of baby blues include: Brief changes in mood, such as going from extreme happiness to sadness. Decreased concentration.  Difficulty sleeping.  Crying spells, tearfulness.  Irritability.  Anxiety.  Symptoms of postpartum depression typically begin within the first month after giving birth. These symptoms include: Difficulty sleeping or excessive sleepiness.  Marked weight loss.  Agitation.  Feelings of worthlessness.  Lack of interest in activity or food.  Postpartum psychosis is a very serious condition and can be dangerous. Fortunately, it is rare. Displaying any of the following symptoms is cause for immediate medical attention. Symptoms of postpartum psychosis include:  Hallucinations and delusions.  Bizarre or disorganized behavior.  Confusion or disorientation.   DIAGNOSIS  A diagnosis is made by an evaluation of your symptoms. There are no medical or lab tests that lead to a diagnosis, but there are various questionnaires that a health care provider may use to identify those with the baby blues, postpartum depression, or psychosis. Often, a screening tool called the [place] Postnatal Depression Scale is used to diagnose depression in the postpartum period.   TREATMENT The baby blues usually goes away on its own in 1-2 weeks. Social support is often all that is  needed. You will be encouraged to get adequate sleep and rest. Occasionally, you may be given medicines to help you sleep.  Postpartum depression requires treatment because it can last several months or  longer if it is not treated. Treatment may include individual or group therapy, medicine, or both to address any social, physiological, and psychological factors that may play a role in the depression. Regular exercise, a healthy diet, rest, and social support may also be strongly recommended.  Postpartum psychosis is more serious and needs treatment right away. Hospitalization is often needed.  HOME CARE INSTRUCTIONS Get as much rest as you can. Nap when the baby sleeps.  Exercise regularly. Some women find yoga and walking to be beneficial.  Eat a balanced and nourishing diet.  Do little things that you enjoy. Have a cup of tea, take a bubble bath, read your favorite magazine, or listen to your favorite music. Avoid alcohol.  Ask for help with household chores, cooking, grocery shopping, or running errands as needed. Do not try to do everything.  Talk to people close to you about how you are feeling. Get support from your partner, family members, friends, or other new moms. Try to stay positive in how you think. Think about the things you are grateful for.  Do not spend a lot of time alone.  Only take over-the-counter or prescription medicine as directed by your health care provider. Keep all your postpartum appointments.  Let your health care provider know if you have any concerns.   SEEK MEDICAL CARE IF: You are having a reaction to or problems with your medicine.  SEEK IMMEDIATE MEDICAL CARE IF: You have suicidal feelings.  You think you may harm the baby or someone else.  MAKE SURE YOU: Understand these instructions. Will watch your condition. Will get help right away if you are not doing well or get worse.   This information is not intended to replace advice given to you by your health care provider. Make sure you discuss any questions you have with your health care provider.   Document Released: 05/24/2004 Document Revised: 08/25/2013 Document Reviewed:  06/01/2013 Elsevier Interactive Patient Education [employer].  Contraception Choices Contraception (birth control) is the use of any methods or devices to prevent pregnancy. Below are some methods to help avoid pregnancy. HORMONAL METHODS   Contraceptive implant. This is a thin, plastic tube containing progesterone hormone. It does not contain estrogen hormone. Your health care provider inserts the tube in the inner part of the upper arm. The tube can remain in place for up to 3 years. After 3 years, the implant must be removed. The implant prevents the ovaries from releasing an egg (ovulation), thickens the cervical mucus to prevent sperm from entering the uterus, and thins the lining of the inside of the uterus.  Progesterone-only injections. These injections are given every 3 months by your health care provider to prevent pregnancy. This synthetic progesterone hormone stops the ovaries from releasing eggs. It also thickens cervical mucus and changes the uterine lining. This makes it harder for sperm to survive in the uterus.  Birth control pills. These pills contain estrogen and progesterone hormone. They work by preventing the ovaries from releasing eggs (ovulation). They also cause the cervical mucus to thicken, preventing the sperm from entering the uterus. Birth control pills are prescribed by a health care provider.Birth control pills can also be used to treat heavy periods.  Minipill. This type of birth control pill contains only the progesterone  hormone. They are taken every day of each month and must be prescribed by your health care provider.  Birth control patch. The patch contains hormones similar to those in birth control pills. It must be changed once a week and is prescribed by a health care provider.  Vaginal ring. The ring contains hormones similar to those in birth control pills. It is left in the vagina for 3 weeks, removed for 1 week, and then a new one is put back in  place. The patient must be comfortable inserting and removing the ring from the vagina.A health care provider's prescription is necessary.  Emergency contraception. Emergency contraceptives prevent pregnancy after unprotected sexual intercourse. This pill can be taken right after sex or up to 5 days after unprotected sex. It is most effective the sooner you take the pills after having sexual intercourse. Most emergency contraceptive pills are available without a prescription. Check with your pharmacist. Do not use emergency contraception as your only form of birth control. BARRIER METHODS   Female condom. This is a thin sheath (latex or rubber) that is worn over the penis during sexual intercourse. It can be used with spermicide to increase effectiveness.  Female condom. This is a soft, loose-fitting sheath that is put into the vagina before sexual intercourse.  Diaphragm. This is a soft, latex, dome-shaped barrier that must be fitted by a health care provider. It is inserted into the vagina, along with a spermicidal jelly. It is inserted before intercourse. The diaphragm should be left in the vagina for 6 to 8 hours after intercourse.  Cervical cap. This is a round, soft, latex or plastic cup that fits over the cervix and must be fitted by a health care provider. The cap can be left in place for up to 48 hours after intercourse.  Sponge. This is a soft, circular piece of polyurethane foam. The sponge has spermicide in it. It is inserted into the vagina after wetting it and before sexual intercourse.  Spermicides. These are chemicals that kill or block sperm from entering the cervix and uterus. They come in the form of creams, jellies, suppositories, foam, or tablets. They do not require a prescription. They are inserted into the vagina with an applicator before having sexual intercourse. The process must be repeated every time you have sexual intercourse. INTRAUTERINE CONTRACEPTION  Intrauterine  device (IUD). This is a T-shaped device that is put in a woman's uterus during a menstrual period to prevent pregnancy. There are 2 types:  Copper IUD. This type of IUD is wrapped in copper wire and is placed inside the uterus. Copper makes the uterus and fallopian tubes produce a fluid that kills sperm. It can stay in place for 10 years.  Hormone IUD. This type of IUD contains the hormone progestin (synthetic progesterone). The hormone thickens the cervical mucus and prevents sperm from entering the uterus, and it also thins the uterine lining to prevent implantation of a fertilized egg. The hormone can weaken or kill the sperm that get into the uterus. It can stay in place for 3-5 years, depending on which type of IUD is used. PERMANENT METHODS OF CONTRACEPTION  Female tubal ligation. This is when the woman's fallopian tubes are surgically sealed, tied, or blocked to prevent the egg from traveling to the uterus.  Hysteroscopic sterilization. This involves placing a small coil or insert into each fallopian tube. Your doctor uses a technique called hysteroscopy to do the procedure. The device causes scar tissue to form. This  results in permanent blockage of the fallopian tubes, so the sperm cannot fertilize the egg. It takes about 3 months after the procedure for the tubes to become blocked. You must use another form of birth control for these 3 months.  Female sterilization. This is when the female has the tubes that carry sperm tied off (vasectomy).This blocks sperm from entering the vagina during sexual intercourse. After the procedure, the man can still ejaculate fluid (semen). NATURAL PLANNING METHODS  Natural family planning. This is not having sexual intercourse or using a barrier method (condom, diaphragm, cervical cap) on days the woman could become pregnant.  Calendar method. This is keeping track of the length of each menstrual cycle and identifying when you are fertile.  Ovulation method.  This is avoiding sexual intercourse during ovulation.  Symptothermal method. This is avoiding sexual intercourse during ovulation, using a thermometer and ovulation symptoms.  Post-ovulation method. This is timing sexual intercourse after you have ovulated. Regardless of which type or method of contraception you choose, it is important that you use condoms to protect against the transmission of sexually transmitted infections (STIs). Talk with your health care provider about which form of contraception is most appropriate for you.   This information is not intended to replace advice given to you by your health care provider. Make sure you discuss any questions you have with your health care provider.   Document Released: 08/20/2005 Document Revised: 08/25/2013 Document Reviewed: 02/12/2013 Elsevier Interactive Patient Education 2016 [employer].  Iron-Rich Diet Iron is a mineral that helps your body to produce hemoglobin. Hemoglobin is a protein in your red blood cells that carries oxygen to your body's tissues. Eating too little iron may cause you to feel weak and tired, and it can increase your risk for infection. Eating enough iron is necessary for your body's metabolism, muscle function, and nervous system. Iron is naturally found in many foods. It can also be added to foods or fortified in foods. There are two types of dietary iron:  Heme iron. Heme iron is absorbed by the body more easily than nonheme iron. Heme iron is found in meat, poultry, and fish.  Nonheme iron. Nonheme iron is found in dietary supplements, iron-fortified grains, beans, and vegetables. You may need to follow an iron-rich diet if:  You have been diagnosed with iron deficiency or iron-deficiency anemia.  You have a condition that prevents you from absorbing dietary iron, such as:  Infection in your intestines.  Celiac disease. This involves long-lasting (chronic) inflammation of your intestines.  You do not  eat enough iron.  You eat a diet that is high in foods that impair iron absorption.  You have lost a lot of blood.  You have heavy bleeding during your menstrual cycle.  You are pregnant. WHAT IS MY PLAN? Your health care provider may help you to determine how much iron you need per day based on your condition. Generally, when a person consumes sufficient amounts of iron in the diet, the following iron needs are met:  Men.  38-85 years old: 11 mg per day.  32-85 years old: 8 mg per day.  Women.   40-25 years old: 15 mg per day.  35-19 years old: 18 mg per day.  Over 72 years old: 8 mg per day.  Pregnant women: 27 mg per day.  Breastfeeding women: 9 mg per day. WHAT DO I NEED TO KNOW ABOUT AN IRON-RICH DIET?  Eat fresh fruits and vegetables that are high in  vitamin C along with foods that are high in iron. This will help increase the amount of iron that your body absorbs from food, especially with foods containing nonheme iron. Foods that are high in vitamin C include oranges, peppers, tomatoes, and mango.  Take iron supplements only as directed by your health care provider. Overdose of iron can be life-threatening. If you were prescribed iron supplements, take them with orange juice or a vitamin C supplement.  Cook foods in pots and pans that are made from iron.   Eat nonheme iron-containing foods alongside foods that are high in heme iron. This helps to improve your iron absorption.   Certain foods and drinks contain compounds that impair iron absorption. Avoid eating these foods in the same meal as iron-rich foods or with iron supplements. These include:  Coffee, black tea, and red wine.  Milk, dairy products, and foods that are high in calcium.  Beans, soybeans, and peas.  Whole grains.  When eating foods that contain both nonheme iron and compounds that impair iron absorption, follow these tips to absorb iron better.   Soak beans overnight before  cooking.  Soak whole grains overnight and drain them before using.  Ferment flours before baking, such as using yeast in bread dough. WHAT FOODS CAN I EAT? Grains Iron-fortified breakfast cereal. Iron-fortified whole-wheat bread. Enriched rice. Sprouted grains. Vegetables Spinach. Potatoes with skin. Green peas. Broccoli. Red and green bell peppers. Fermented vegetables. Fruits Prunes. Raisins. Oranges. Strawberries. Mango. Grapefruit. Meats and Other Protein Sources Beef liver. Oysters. Beef. Shrimp. [place]. Chicken. [place]. Sardines. Chickpeas. Nuts. Tofu. Beverages Tomato juice. Fresh orange juice. Prune juice. Hibiscus tea. Fortified instant breakfast shakes. Condiments Tahini. Fermented soy sauce. Sweets and Desserts Black-strap molasses.  Other Wheat germ. The items listed above may not be a complete list of recommended foods or beverages. Contact your dietitian for more options. WHAT FOODS ARE NOT RECOMMENDED? Grains Whole grains. Bran cereal. Bran flour. Oats. Vegetables Artichokes. Brussels sprouts. Kale. Fruits Blueberries. Raspberries. Strawberries. Figs. Meats and Other Protein Sources Soybeans. Products made from soy protein. Dairy Milk. Cream. Cheese. Yogurt. Cottage cheese. Beverages Coffee. Black tea. Red wine. Sweets and Desserts Cocoa. Chocolate. Ice cream. Other Basil. Oregano. Parsley. The items listed above may not be a complete list of foods and beverages to avoid. Contact your dietitian for more information.   This information is not intended to replace advice given to you by your health care provider. Make sure you discuss any questions you have with your health care provider.   Document Released: 04/03/2005 Document Revised: 09/10/2014 Document Reviewed: 03/17/2014 Elsevier Interactive Patient Education [employer].

## 2015-09-16 NOTE — Discharge Summary (Signed)
Vaginal Delivery Discharge Summary  Kaitlyn MedianDulce Boxer  DOB:    08-Jun-1996 MRN:    [id number] CSN:    [id number]  Date of admission:                  09/13/2015   Date of discharge:                   09/16/2015  Procedures this admission:   SVD with repair of 2nd Degree Perineal Laceration  Date of Delivery: 09/14/2015  Newborn Data:  Live born female  Birth Weight: 7 lb 2.3 oz (3240 g) APGAR: 9, 10  Home with mother. Name: Deleyza Circumcision Plan: N/A  History of Present Illness:  Ms. Kaitlyn Hardin is a 20 y.o. female, G1P1001, who presents at 2890w2d weeks gestation. [facility]. She was admitted onset of labor. Her pregnancy has been complicated by:  Patient Active Problem List   Diagnosis Date Noted  . Second-degree perineal laceration, with delivery 09/16/2015  . SVD (spontaneous vaginal delivery) 09/16/2015  . Active labor at term 09/14/2015  . Normal labor 09/14/2015  . Anemia affecting pregnancy 08/04/2015  . Positive GBS test 08/04/2015  . BV (bacterial vaginosis) 08/04/2015  . Premature uterine contractions, antepartum 08/04/2015  . Asthma 08/04/2015     Hospital Course:  Admitted 09/13/2015. Positive GBS. Progressed without intervention. Utilized epidural for pain management.  Delivery was performed by V. Standard, CNM without complication. Patient and baby tolerated the procedure without difficulty, with  2nd degree laceration noted. Infant status was stable and remained in room with mother.  Mother and infant then had an uncomplicated postpartum course, with breast feeding going well. Mom's physical exam was WNL, and she was discharged home in stable condition. Contraception plan was undecided.  She received adequate benefit from po pain medications.   Feeding:  breast  Contraception:  Undecided  Hemoglobin Results:  CBC Latest Ref Rng 09/15/2015 09/14/2015  01/23/2015  WBC 4.0 - 10.5 K/uL 11.6(H) 11.7(H) -  Hemoglobin 12.0 - 15.0 g/dL 7.8(L) 11.2(L) 12.6  Hematocrit 36.0 - 46.0 % 23.1(L) 33.1(L) 37.0  Platelets 150 - 400 K/uL 152 203 -     Discharge Physical Exam:   General: alert, cooperative, no distress and pale Mood/Affect: Appropriate/Bright Chest: clear to auscultation, no wheezes, rales or rhonchi, symmetric air entry.  CVS exam: normal rate and regular rhythm. Breast exam: not examined. Abdomen:  + bowel sounds, soft Uterine Fundus: firm, U/-2 Lochia: appropriate Laceration: Not Examined DVT Evaluation: No evidence of DVT seen on physical exam. No significant calf/ankle edema. Skin exam: Warm, Dry  Procedures &/or Complications: Intrapartum Procedures: spontaneous vaginal delivery and GBS prophylaxis Postpartum Procedures: none Complications-Operative and Postpartum: 2nd degree perineal laceration   Discharge Information:  Diagnoses: Term Pregnancy-delivered Activity:  pelvic rest Diet:   routine Medications: Ibuprofen and Iron Condition: stable Instructions:  Pain Management, Peri-Care, Breastfeeding, Who and When to call for postpartum complications. Information Sheet(s) given Pelvic Rest, PPD&BB, Contraception Choices, Iron rich diet Discharge to: home  Follow-up Information    [facility]. Schedule an appointment as soon as possible for a visit in 6 weeks.   Specialty:  Obstetrics and Gynecology   Why:  Please call if you have any questions or concerns, prior to your next visit.   Contact information:   [address] [place] [id number] [phone]  Cherre Robins MSN, CNM 09/16/2015 8:17 AM

## 2015-12-25 IMAGING — US US OB TRANSVAGINAL
1 series · 14 of 19 positions shown · non-contrast
Comparison: None.

CLINICAL DATA: Pregnant, unsure LMP

EXAM:
TRANSVAGINAL OB ULTRASOUND
TECHNIQUE: Transvaginal ultrasound was performed for complete evaluation of the
gestation as well as the maternal uterus, adnexal regions, and
pelvic cul-de-sac.

[Series 1: us ob transvaginal · 0.11mm/px · 14 of 19 slices shown]
[im 1/19]
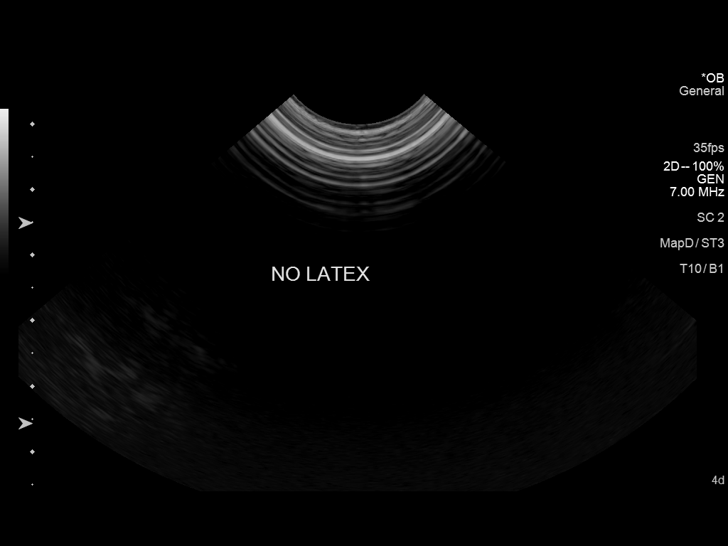
[im 3/19]
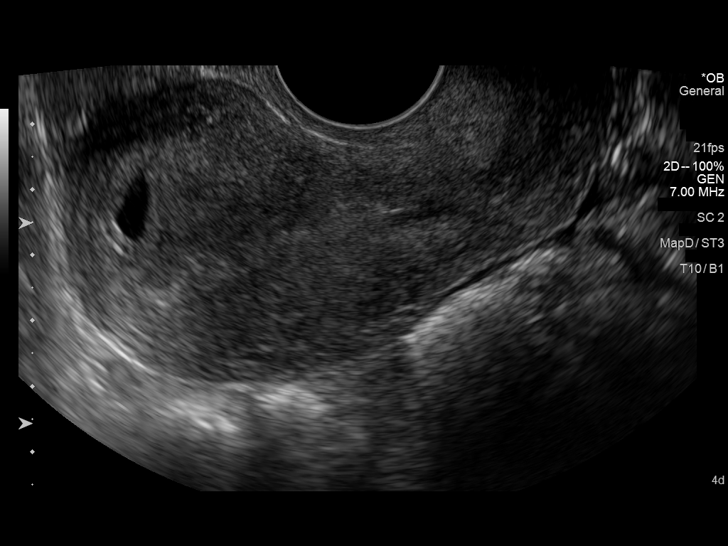
[im 4/19]
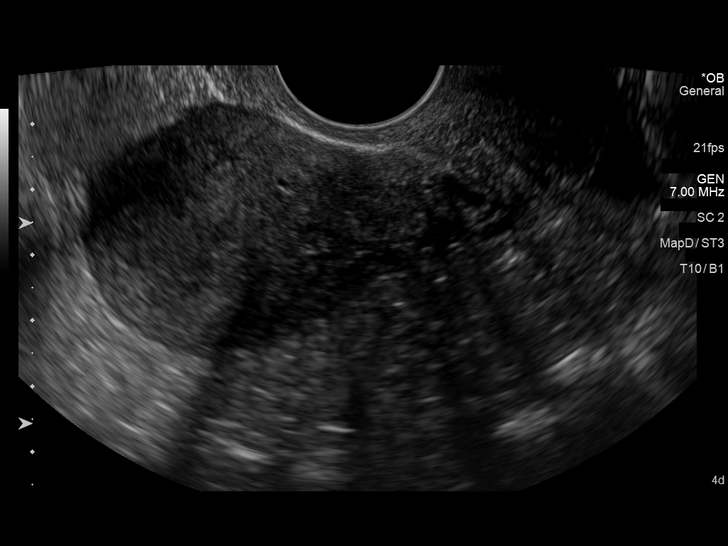
[im 5/19]
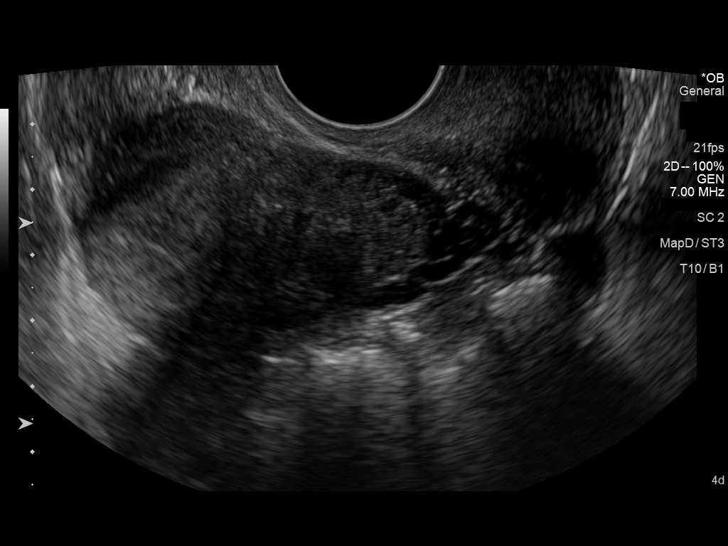
[im 7/19]
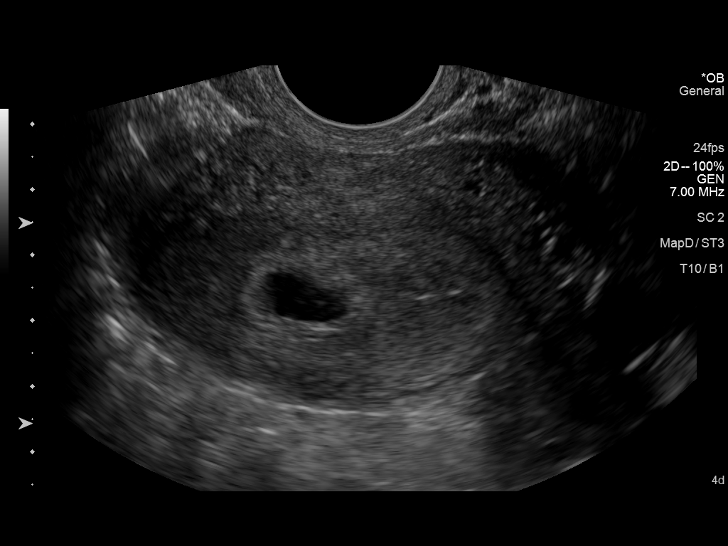
[im 8/19]
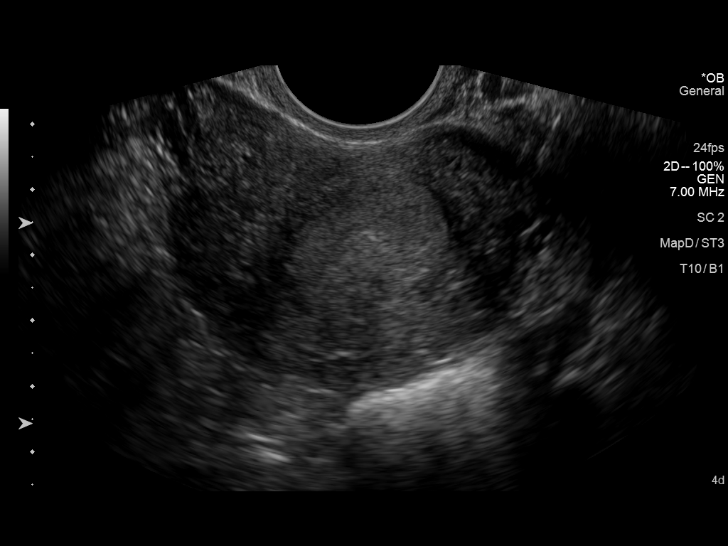
[im 9/19]
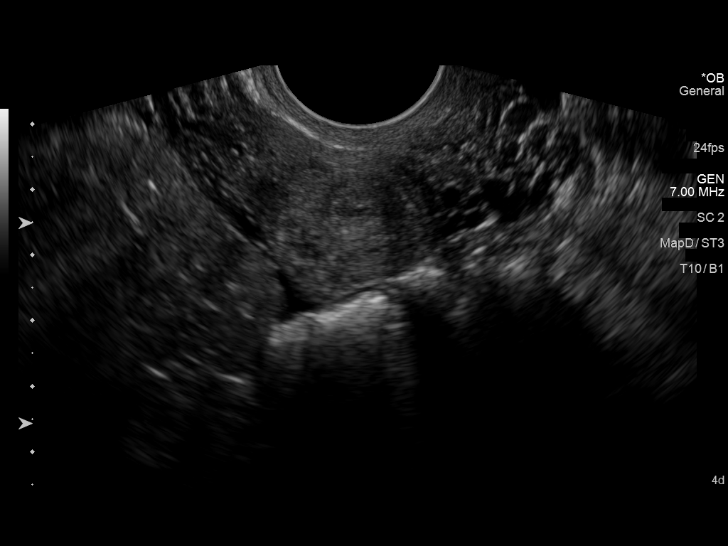
[im 11/19]
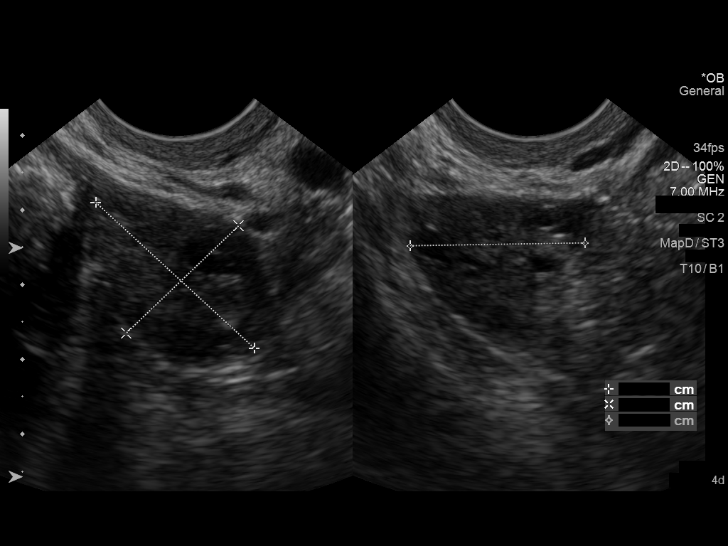
[im 12/19]
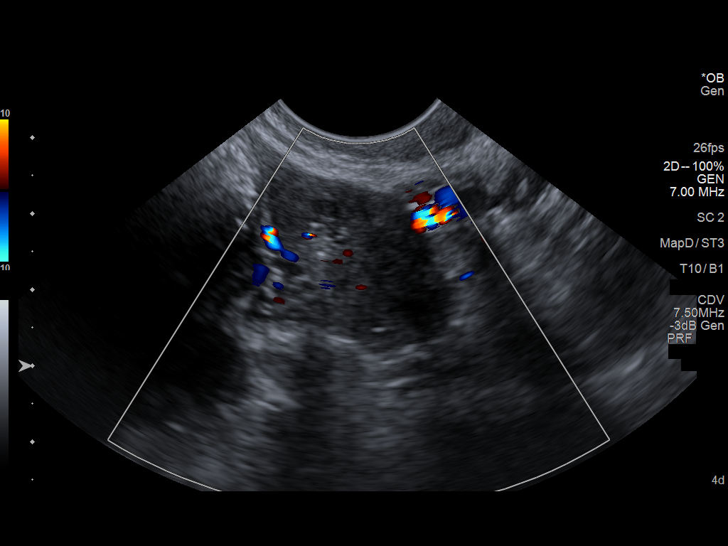
[im 13/19]
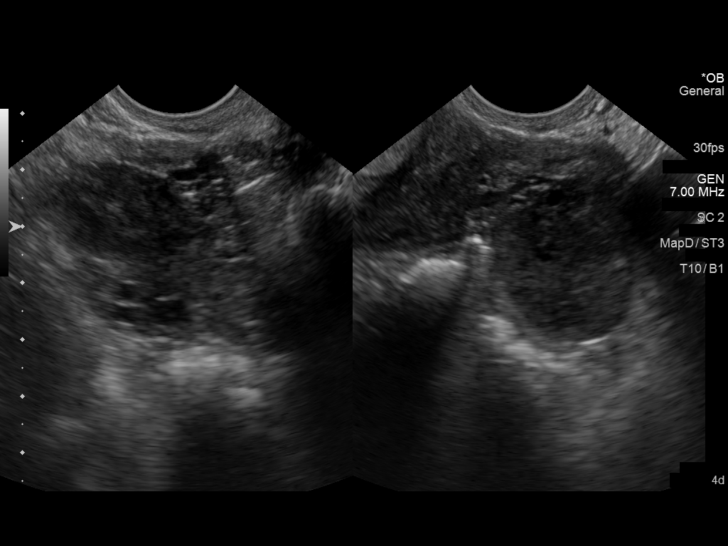
[im 15/19]
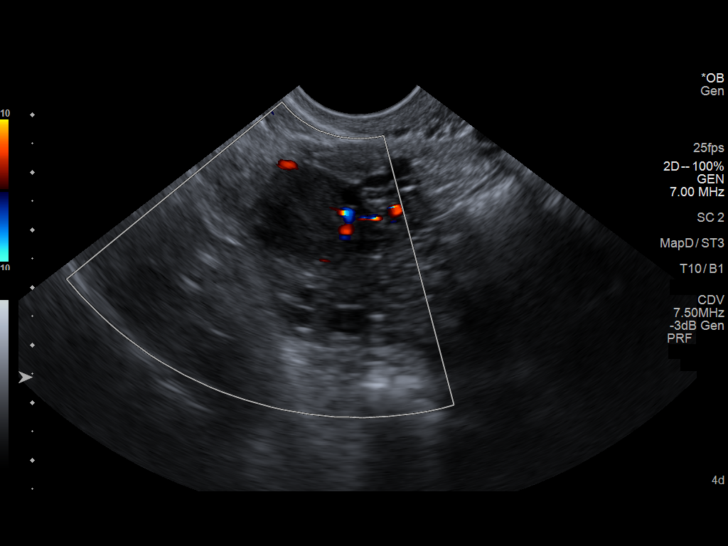
[im 16/19]
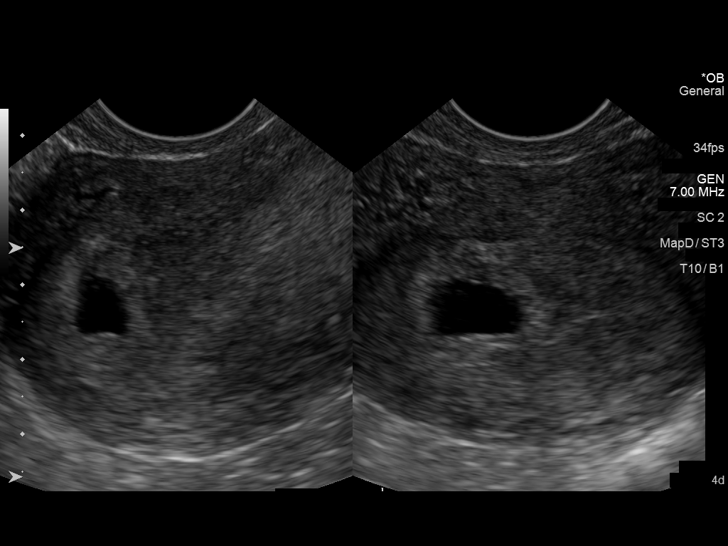
[im 17/19]
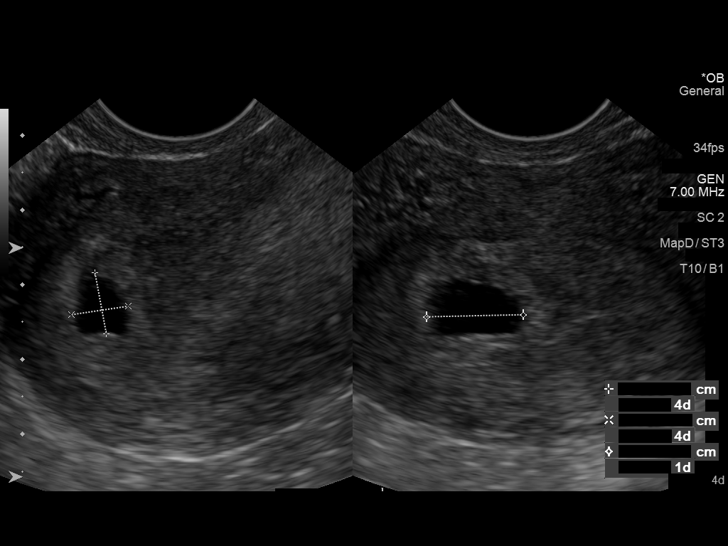
[im 19/19]
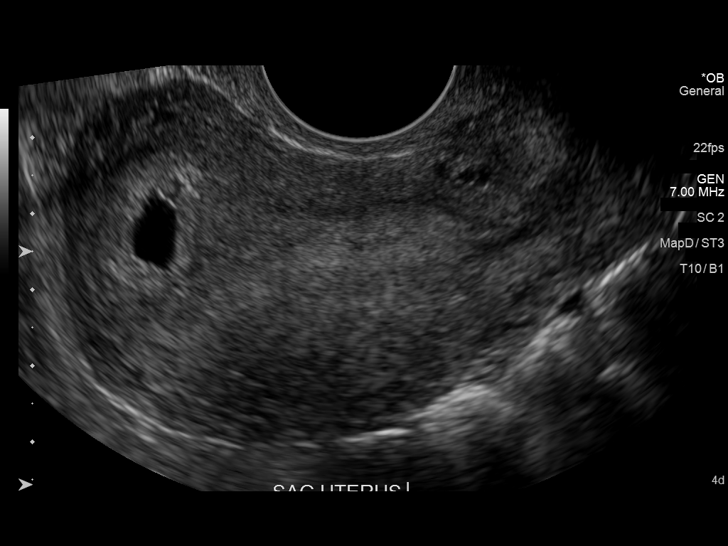

[14 of 19 positions shown; findings below may reference images not displayed]

FINDINGS: Intrauterine gestational sac: Visualized/normal in shape.

Yolk sac:  Present

Embryo:  Not visualized

MSD: 9.6  mm   5 w   5  d

Maternal uterus/adnexae: No subchronic hemorrhage.

Bilateral ovaries are within normal limits.

Trace pelvic fluid.
IMPRESSION: Single intrauterine gestation with yolk sac, measuring 5 weeks 5
days by mean sac diameter.

No fetal pole is visualized.

Consider follow-up pelvic ultrasound in 14 days to confirm viability
as clinically warranted.

## 2016-07-19 ENCOUNTER — Encounter (HOSPITAL_COMMUNITY): Payer: Self-pay | Admitting: Emergency Medicine

## 2016-07-19 DIAGNOSIS — N760 Acute vaginitis: Secondary | ICD-10-CM | POA: Insufficient documentation

## 2016-07-19 DIAGNOSIS — J45909 Unspecified asthma, uncomplicated: Secondary | ICD-10-CM | POA: Insufficient documentation

## 2016-07-19 DIAGNOSIS — R51 Headache: Secondary | ICD-10-CM | POA: Insufficient documentation

## 2016-07-19 DIAGNOSIS — B9689 Other specified bacterial agents as the cause of diseases classified elsewhere: Secondary | ICD-10-CM | POA: Insufficient documentation

## 2016-07-19 LAB — URINALYSIS, ROUTINE W REFLEX MICROSCOPIC
BILIRUBIN URINE: NEGATIVE
Glucose, UA: NEGATIVE mg/dL
Hgb urine dipstick: NEGATIVE
KETONES UR: 15 mg/dL — AB
LEUKOCYTES UA: NEGATIVE
NITRITE: NEGATIVE
PROTEIN: NEGATIVE mg/dL
Specific Gravity, Urine: 1.027 (ref 1.005–1.030)
pH: 6 (ref 5.0–8.0)

## 2016-07-19 LAB — CBC
HCT: 37.5 % (ref 36.0–46.0)
Hemoglobin: 13.2 g/dL (ref 12.0–15.0)
MCH: 30.8 pg (ref 26.0–34.0)
MCHC: 35.2 g/dL (ref 30.0–36.0)
MCV: 87.4 fL (ref 78.0–100.0)
PLATELETS: 257 10*3/uL (ref 150–400)
RBC: 4.29 MIL/uL (ref 3.87–5.11)
RDW: 12.1 % (ref 11.5–15.5)
WBC: 10.8 10*3/uL — AB (ref 4.0–10.5)

## 2016-07-19 LAB — COMPREHENSIVE METABOLIC PANEL
ALBUMIN: 4.5 g/dL (ref 3.5–5.0)
ALK PHOS: 78 U/L (ref 38–126)
ALT: 13 U/L — AB (ref 14–54)
AST: 21 U/L (ref 15–41)
Anion gap: 11 (ref 5–15)
BILIRUBIN TOTAL: 0.5 mg/dL (ref 0.3–1.2)
BUN: 11 mg/dL (ref 6–20)
CALCIUM: 9.5 mg/dL (ref 8.9–10.3)
CO2: 22 mmol/L (ref 22–32)
CREATININE: 0.5 mg/dL (ref 0.44–1.00)
Chloride: 107 mmol/L (ref 101–111)
GFR calc Af Amer: >60 mL/min (ref >60)
GLUCOSE: 90 mg/dL (ref 65–99)
POTASSIUM: 3.3 mmol/L — AB (ref 3.5–5.1)
Sodium: 140 mmol/L (ref 135–145)
TOTAL PROTEIN: 7.7 g/dL (ref 6.5–8.1)

## 2016-07-19 LAB — I-STAT BETA HCG BLOOD, ED (MC, WL, AP ONLY): I-stat hCG, quantitative: <5 m[IU]/mL (ref <5)

## 2016-07-19 LAB — LIPASE, BLOOD: Lipase: 31 U/L (ref 11–51)

## 2016-07-19 NOTE — ED Triage Notes (Signed)
Pt states "ive been feeling dizzy and I dont get hungry, ive been getting headaches as well. I've been also been feeling anxiety. And my arm has been hurting where I have my birth control implant. Also im hurting in my pelvic area." Pt in NAD. Ambulatory in triage. Resp e/u

## 2016-07-20 ENCOUNTER — Emergency Department (HOSPITAL_COMMUNITY)
Admission: EM | Admit: 2016-07-20 | Discharge: 2016-07-20 | Disposition: A | Discharge status: Home / Self Care | Payer: Medicaid Other | Attending: Emergency Medicine | Admitting: Emergency Medicine

## 2016-07-20 DIAGNOSIS — B9689 Other specified bacterial agents as the cause of diseases classified elsewhere: Secondary | ICD-10-CM

## 2016-07-20 DIAGNOSIS — N760 Acute vaginitis: Secondary | ICD-10-CM

## 2016-07-20 LAB — WET PREP, GENITAL
Sperm: NONE SEEN
TRICH WET PREP: NONE SEEN
Yeast Wet Prep HPF POC: NONE SEEN

## 2016-07-20 LAB — GC/CHLAMYDIA PROBE AMP (~~LOC~~) NOT AT ARMC
CHLAMYDIA, DNA PROBE: NEGATIVE
NEISSERIA GONORRHEA: NEGATIVE

## 2016-07-20 MED ORDER — TRAMADOL HCL 50 MG PO TABS
50.0000 mg | ORAL_TABLET | Freq: Once | ORAL | Status: AC
  Administered 2016-07-20: 50 mg via ORAL
  Filled 2016-07-20: qty 1

## 2016-07-20 MED ORDER — ONDANSETRON 4 MG PO TBDP
4.0000 mg | ORAL_TABLET | Freq: Once | ORAL | Status: AC
  Administered 2016-07-20: 4 mg via ORAL
  Filled 2016-07-20: qty 1

## 2016-07-20 MED ORDER — LIDOCAINE HCL (PF) 1 % IJ SOLN
INTRAMUSCULAR | Status: AC
  Administered 2016-07-20: 1 mL
  Filled 2016-07-20: qty 5

## 2016-07-20 MED ORDER — TRAMADOL HCL 50 MG PO TABS: 50.0000 mg | ORAL_TABLET | Freq: Four times a day (QID) | ORAL | 0 refills | Status: DC | PRN

## 2016-07-20 MED ORDER — AZITHROMYCIN 250 MG PO TABS
1000.0000 mg | ORAL_TABLET | Freq: Once | ORAL | Status: AC
  Administered 2016-07-20: 1000 mg via ORAL
  Filled 2016-07-20: qty 4

## 2016-07-20 MED ORDER — ONDANSETRON HCL 4 MG PO TABS: 4.0000 mg | ORAL_TABLET | Freq: Four times a day (QID) | ORAL | 0 refills | Status: DC

## 2016-07-20 MED ORDER — METRONIDAZOLE 500 MG PO TABS: 500.0000 mg | ORAL_TABLET | Freq: Two times a day (BID) | ORAL | 0 refills | Status: DC

## 2016-07-20 MED ORDER — CEFTRIAXONE SODIUM 250 MG IJ SOLR
250.0000 mg | Freq: Once | INTRAMUSCULAR | Status: AC
Start: 2016-07-20 — End: 2016-07-20
  Administered 2016-07-20: 250 mg via INTRAMUSCULAR
  Filled 2016-07-20: qty 250

## 2016-07-20 MED ORDER — ONDANSETRON 4 MG PO TBDP
4.0000 mg | ORAL_TABLET | Freq: Once | ORAL | Status: AC
Start: 2016-07-20 — End: 2016-07-20
  Administered 2016-07-20: 4 mg via ORAL
  Filled 2016-07-20: qty 1

## 2016-07-20 NOTE — ED Notes (Signed)
Patient stated that her head was still hurting..Kaitlyn Hardin

## 2016-07-20 NOTE — ED Provider Notes (Signed)
[facility] DEPT Provider Note   CSN: [id number] Arrival date & time: 07/19/16  2221  History   Chief Complaint Chief Complaint  Patient presents with  . Headache  . Pelvic Pain  . Arm Pain   HPI Kaitlyn Hardin is a 20 y.o. female.  HPI  Patient to the ER with multiple complaints; anxiety, dizziness, decreased appetite, headaches,left arm pain, and pelvic pain. She says that her symptoms started a few weeks ago. Her most bothersome symptoms she would like to be seen for today is the pelvic pain. She is not sure if she has had any discharge. The pain does not lateralize, periods have been unremarkable. She denies any injury or rash, she has not called the provider that placed the implant. She has had decreased appetite but has still been eating and drinking. Dizziness she believes is due to the headache. She describes the headache has "bad" and 8/10, generalized.  Past Medical History:  Diagnosis Date  . Asthma   . Infection     Patient Active Problem List   Diagnosis Date Noted  . Second-degree perineal laceration, with delivery 09/16/2015  . SVD (spontaneous vaginal delivery) 09/16/2015  . Active labor at term 09/14/2015  . Normal labor 09/14/2015  . Anemia affecting pregnancy 08/04/2015  . Positive GBS test 08/04/2015  . BV (bacterial vaginosis) 08/04/2015  . Premature uterine contractions, antepartum 08/04/2015  . Asthma 08/04/2015    Past Surgical History:  Procedure Laterality Date  . NO PAST SURGERIES      OB History    Gravida Para Term Preterm AB Living   1 1 1     1    SAB TAB Ectopic Multiple Live Births         0 1       Home Medications    Prior to Admission medications   Medication Sig Start Date End Date Taking? Authorizing Provider  ferrous sulfate 325 (65 FE) MG tablet Take 1 tablet (325 mg total) by mouth 2 (two) times daily with a meal. For 14 days, then once daily for 28 days. 09/16/15   Gerrit HeckJessica Emly, CNM  ibuprofen (ADVIL,MOTRIN) 600  MG tablet Take 1 tablet (600 mg total) by mouth every 6 (six) hours. 09/16/15   Gerrit HeckJessica Emly, CNM  metroNIDAZOLE (FLAGYL) 500 MG tablet Take 1 tablet (500 mg total) by mouth 2 (two) times daily. 07/20/16   Treshaun Carrico Neva SeatGreene, PA-C  ondansetron (ZOFRAN) 4 MG tablet Take 1 tablet (4 mg total) by mouth every 6 (six) hours. 07/20/16   Marlon Peliffany Jemal Miskell, PA-C  Prenatal Vit-Fe Fumarate-FA (PRENATAL MULTIVITAMIN) TABS tablet Take 1 tablet by mouth daily at 12 noon.    Historical Provider, MD  traMADol (ULTRAM) 50 MG tablet Take 1 tablet (50 mg total) by mouth every 6 (six) hours as needed. 07/20/16   Marlon Peliffany Treylon Henard, PA-C    Family History No family history on file.  Social History Social History  Substance Use Topics  . Smoking status: Never Smoker  . Smokeless tobacco: Not on file  . Alcohol use No     Allergies   Patient has no known allergies.   Review of Systems Review of Systems Review of Systems All other systems negative except as documented in the HPI. All pertinent positives and negatives as reviewed in the HPI.   Physical Exam Updated Vital Signs BP 102/71   Pulse 88   Temp 98.3 F (36.8 C) (Oral)   Resp 16   LMP  (LMP Unknown)   SpO2  99%   Physical Exam  Constitutional: She is oriented to person, place, and time. She appears well-developed and well-nourished. No distress.  HENT:  Head: Normocephalic and atraumatic.  Nose: Nose normal.  Mouth/Throat: Uvula is midline, oropharynx is clear and moist and mucous membranes are normal.  Eyes: Pupils are equal, round, and reactive to light.  Neck: Normal range of motion. Neck supple.  Cardiovascular: Normal rate and regular rhythm.   Pulmonary/Chest: Effort normal.  Abdominal: Soft.  No signs of abdominal distention  Genitourinary: There is no rash, tenderness or lesion on the right labia. There is no rash, tenderness or lesion on the left labia. Uterus is tender. Cervix exhibits no motion tenderness, no discharge and no  friability. Right adnexum displays tenderness. Right adnexum displays no mass. Left adnexum displays tenderness. Left adnexum displays no mass. There is erythema in the vagina. No tenderness in the vagina. No foreign body in the vagina. No signs of injury around the vagina. No vaginal discharge found.  Musculoskeletal:  No LE swelling  Neurological: She is alert and oriented to person, place, and time. No cranial nerve deficit or sensory deficit.  Acting at baseline  Skin: Skin is warm and dry. No rash noted.  Nursing note and vitals reviewed.    ED Treatments / Results  Labs (all labs ordered are listed, but only abnormal results are displayed) Labs Reviewed  WET PREP, GENITAL - Abnormal; Notable for the following:       Result Value   Clue Cells Wet Prep HPF POC PRESENT (*)    WBC, Wet Prep HPF POC MODERATE (*)    All other components within normal limits  COMPREHENSIVE METABOLIC PANEL - Abnormal; Notable for the following:    Potassium 3.3 (*)    ALT 13 (*)    All other components within normal limits  CBC - Abnormal; Notable for the following:    WBC 10.8 (*)    All other components within normal limits  URINALYSIS, ROUTINE W REFLEX MICROSCOPIC (NOT AT [facility]) - Abnormal; Notable for the following:    Ketones, ur 15 (*)    All other components within normal limits  LIPASE, BLOOD  I-STAT BETA HCG BLOOD, ED ([facility], [facility], [facility] ONLY)  GC/CHLAMYDIA PROBE AMP ([place]) NOT AT [facility]    EKG  EKG Interpretation None       Radiology No results found.  Procedures Procedures (including critical care time)  Medications Ordered in ED Medications  cefTRIAXone (ROCEPHIN) injection 250 mg (not administered)  azithromycin (ZITHROMAX) tablet 1,000 mg (not administered)  ondansetron (ZOFRAN-ODT) disintegrating tablet 4 mg (not administered)  traMADol (ULTRAM) tablet 50 mg (50 mg Oral Given 07/20/16 0408)  ondansetron (ZOFRAN-ODT) disintegrating tablet 4 mg (4 mg Oral Given 07/20/16  0407)     Initial Impression / Assessment and Plan / ED Course  I have reviewed the triage vital signs and the nursing notes.  Pertinent labs & imaging results that were available during my care of the patient were reviewed by me and considered in my medical decision making (see chart for details).  Clinical Course    Patients headache and dizziness improved with pain medication in the ED. She is not orthostatic, neg pregnancy, her blood work is unremarkable. Wet prep shows clue cells and WBC, treated in the ED for WBC and flagyl for home.  Vitals:   07/19/16 2237 07/20/16 0054 07/20/16 0233 07/20/16 0300  BP: 124/90 113/74 107/69 102/71  Pulse: 116 90 90 88  Resp:  22 20 16    Temp: 98.4 F (36.9 C) 98.3 F (36.8 C)    TempSrc: Oral Oral    SpO2: 100% 100% 100% 99%     Final Clinical Impressions(s) / ED Diagnoses   Final diagnoses:  Bacterial vaginosis    New Prescriptions New Prescriptions   METRONIDAZOLE (FLAGYL) 500 MG TABLET    Take 1 tablet (500 mg total) by mouth 2 (two) times daily.   ONDANSETRON (ZOFRAN) 4 MG TABLET    Take 1 tablet (4 mg total) by mouth every 6 (six) hours.   TRAMADOL (ULTRAM) 50 MG TABLET    Take 1 tablet (50 mg total) by mouth every 6 (six) hours as needed.     Marlon Pel, PA-C 07/20/16 1610    Glynn Octave, MD 07/20/16 [phone]

## 2016-09-13 ENCOUNTER — Encounter (HOSPITAL_COMMUNITY): Payer: Self-pay | Admitting: Emergency Medicine

## 2016-09-13 ENCOUNTER — Emergency Department (HOSPITAL_COMMUNITY)
Admission: EM | Admit: 2016-09-13 | Discharge: 2016-09-13 | Disposition: A | Discharge status: Home / Self Care | Payer: Medicaid Other | Attending: Dermatology | Admitting: Dermatology

## 2016-09-13 DIAGNOSIS — R0602 Shortness of breath: Secondary | ICD-10-CM | POA: Insufficient documentation

## 2016-09-13 DIAGNOSIS — Z5321 Procedure and treatment not carried out due to patient leaving prior to being seen by health care provider: Secondary | ICD-10-CM | POA: Insufficient documentation

## 2016-09-13 NOTE — ED Triage Notes (Signed)
Pt. Stated, I've had sob today since 1300 today.  Pt. Has an inhaler but has not needed it in a year. Pt. Anxious at triage, pt. Has calmed down a lot.

## 2016-09-13 NOTE — ED Notes (Signed)
Pt called for vitals x2. No answer.  

## 2016-09-13 NOTE — ED Notes (Signed)
Pt called for in waiting area no answer.  

## 2017-04-27 ENCOUNTER — Encounter (HOSPITAL_COMMUNITY): Payer: Self-pay

## 2017-04-27 ENCOUNTER — Emergency Department (HOSPITAL_COMMUNITY)
Admission: EM | Admit: 2017-04-27 | Discharge: 2017-04-27 | Disposition: A | Discharge status: Home / Self Care | Payer: Medicaid Other | Attending: Emergency Medicine | Admitting: Emergency Medicine

## 2017-04-27 DIAGNOSIS — R51 Headache: Secondary | ICD-10-CM | POA: Diagnosis not present

## 2017-04-27 DIAGNOSIS — J45909 Unspecified asthma, uncomplicated: Secondary | ICD-10-CM | POA: Insufficient documentation

## 2017-04-27 DIAGNOSIS — R519 Headache, unspecified: Secondary | ICD-10-CM

## 2017-04-27 LAB — URINALYSIS, ROUTINE W REFLEX MICROSCOPIC
Bilirubin Urine: NEGATIVE
Glucose, UA: NEGATIVE mg/dL
HGB URINE DIPSTICK: NEGATIVE
KETONES UR: NEGATIVE mg/dL
Leukocytes, UA: NEGATIVE
NITRITE: NEGATIVE
PROTEIN: NEGATIVE mg/dL
Specific Gravity, Urine: 1.018 (ref 1.005–1.030)
pH: 7 (ref 5.0–8.0)

## 2017-04-27 LAB — POC URINE PREG, ED: PREG TEST UR: NEGATIVE

## 2017-04-27 MED ORDER — BUTALBITAL-APAP-CAFFEINE 50-325-40 MG PO TABS: 1.0000 | ORAL_TABLET | Freq: Four times a day (QID) | ORAL | 0 refills | Status: DC | PRN

## 2017-04-27 MED ORDER — ACETAMINOPHEN 500 MG PO TABS
1000.0000 mg | ORAL_TABLET | Freq: Once | ORAL | Status: AC
  Administered 2017-04-27: 1000 mg via ORAL
  Filled 2017-04-27: qty 2

## 2017-04-27 NOTE — ED Notes (Signed)
Patient updated on wait times 

## 2017-04-27 NOTE — ED Provider Notes (Signed)
[facility] Provider Note   CSN: [id number] Arrival date & time: 04/27/17  1402     History   Chief Complaint Chief Complaint  Patient presents with  . Headache    HPI Kaitlyn Hardin is a 21 y.o. female.  HPI  21 year old female, past medical history noncontributory, states that she has had 1 child in the past and has had an implantable birth control in her left upper extremity for the last year. She has multiple complaints today including #1 having a headache with some muscle aches, #2 she has been having pain around her implantable birth control for the last year unchanged and not associated with fevers or redness. #3 the patient has urinary dysuria frequency and some lower abdominal discomfort. She has no fevers, no chills, no nausea vomiting or diarrhea.  The discomfort with urination has been going on for 3 days, the headache is been going on for 24 hours.  She also reports bilateral breast tenderness without discharge or bleeding from the nipples.  Past Medical History:  Diagnosis Date  . Asthma   . Infection     Patient Active Problem List   Diagnosis Date Noted  . Second-degree perineal laceration, with delivery 09/16/2015  . SVD (spontaneous vaginal delivery) 09/16/2015  . Active labor at term 09/14/2015  . Normal labor 09/14/2015  . Anemia affecting pregnancy 08/04/2015  . Positive GBS test 08/04/2015  . BV (bacterial vaginosis) 08/04/2015  . Premature uterine contractions, antepartum 08/04/2015  . Asthma 08/04/2015    Past Surgical History:  Procedure Laterality Date  . NO PAST SURGERIES      OB History    Gravida Para Term Preterm AB Living   1 1 1     1    SAB TAB Ectopic Multiple Live Births         0 1       Home Medications    Prior to Admission medications   Medication Sig Start Date End Date Taking? Authorizing Provider  PRESCRIPTION MEDICATION Birth control implant   Yes [provider]  ferrous sulfate 325 (65 FE) MG  tablet Take 1 tablet (325 mg total) by mouth 2 (two) times daily with a meal. For 14 days, then once daily for 28 days. Patient not taking: Reported on 07/20/2016 09/16/15   Gerrit Heck, CNM  ibuprofen (ADVIL,MOTRIN) 600 MG tablet Take 1 tablet (600 mg total) by mouth every 6 (six) hours. Patient not taking: Reported on 07/20/2016 09/16/15   Gerrit Heck, CNM  metroNIDAZOLE (FLAGYL) 500 MG tablet Take 1 tablet (500 mg total) by mouth 2 (two) times daily. Patient not taking: Reported on 04/27/2017 07/20/16   Marlon Pel, PA-C  ondansetron (ZOFRAN) 4 MG tablet Take 1 tablet (4 mg total) by mouth every 6 (six) hours. Patient not taking: Reported on 04/27/2017 07/20/16   Marlon Pel, PA-C  traMADol (ULTRAM) 50 MG tablet Take 1 tablet (50 mg total) by mouth every 6 (six) hours as needed. Patient not taking: Reported on 04/27/2017 07/20/16   Marlon Pel, PA-C    Family History History reviewed. No pertinent family history.  Social History Social History  Substance Use Topics  . Smoking status: Never Smoker  . Smokeless tobacco: Never Used  . Alcohol use No     Allergies   Patient has no known allergies.   Review of Systems Review of Systems  All other systems reviewed and are negative.    Physical Exam Updated Vital Signs BP 117/85 (BP Location: Right Arm)  Pulse 98   Temp 98.2 F (36.8 C) (Oral)   Resp 16   SpO2 99%   Physical Exam  Constitutional: She appears well-developed and well-nourished. No distress.  HENT:  Head: Normocephalic and atraumatic.  Mouth/Throat: Oropharynx is clear and moist. No oropharyngeal exudate.  Eyes: Pupils are equal, round, and reactive to light. Conjunctivae and EOM are normal. Right eye exhibits no discharge. Left eye exhibits no discharge. No scleral icterus.  Neck: Normal range of motion. Neck supple. No JVD present. No thyromegaly present.  Cardiovascular: Normal rate, regular rhythm, normal heart sounds and intact distal  pulses.  Exam reveals no gallop and no friction rub.   No murmur heard. Chaperone present for exam, bilateral breast with normal appearance, symmetrical, no redness, no retraction of the nipples, no discharge or bleeding from the nipples, no masses palpated though there is bilateral diffuse patchy tenderness.  Pulmonary/Chest: Effort normal and breath sounds normal. No respiratory distress. She has no wheezes. She has no rales.  Abdominal: Soft. Bowel sounds are normal. She exhibits no distension and no mass. There is no tenderness.  Musculoskeletal: Normal range of motion. She exhibits no edema or tenderness.  Lymphadenopathy:    She has no cervical adenopathy.  No lymphadenopathy of the bilateral axilla  Neurological: She is alert. Coordination normal.  Skin: Skin is warm and dry. No rash noted. No erythema.  Psychiatric: She has a normal mood and affect. Her behavior is normal.  Nursing note and vitals reviewed.    ED Treatments / Results  Labs (all labs ordered are listed, but only abnormal results are displayed) Labs Reviewed  URINALYSIS, ROUTINE W REFLEX MICROSCOPIC  POC URINE PREG, ED    Radiology No results found.  Procedures Procedures (including critical care time)  Medications Ordered in ED Medications  acetaminophen (TYLENOL) tablet 1,000 mg (not administered)     Initial Impression / Assessment and Plan / ED Course  I have reviewed the triage vital signs and the nursing notes.  Pertinent labs & imaging results that were available during my care of the patient were reviewed by me and considered in my medical decision making (see chart for details).   Headache unremarkable as the patient has a normal neurologic exam with speech coordination gait and strength. Left upper extremity with tenderness over the implantable birth control, there is no redness masses fevers or induration of the skin.  Bilateral breast tenderness without lymphadenopathy, likely secondary  to other process, does not appear infectious or malignant  Urinary symptoms will be evaluated with urinary pregnancy and urinalysis. Patient agreeable, Tylenol ordered.  Headache better UA and preg neg Pt informed Given small am't of fioricet fo rhome  Vitals:   04/27/17 1521  BP: 117/85  Pulse: 98  Resp: 16  Temp: 98.2 F (36.8 C)  TempSrc: Oral  SpO2: 99%     Final Clinical Impressions(s) / ED Diagnoses   Final diagnoses:  None    New Prescriptions New Prescriptions   No medications on file     Eber Hong, MD 04/27/17 2042

## 2017-04-27 NOTE — Discharge Instructions (Signed)

## 2017-04-27 NOTE — ED Triage Notes (Addendum)
Onset this morning bilateral breast pain, left upper arm BC implant pain, headache, and body aches.

## 2017-08-21 ENCOUNTER — Encounter (HOSPITAL_COMMUNITY): Payer: Self-pay

## 2017-08-21 ENCOUNTER — Other Ambulatory Visit: Payer: Self-pay

## 2017-08-21 ENCOUNTER — Inpatient Hospital Stay (HOSPITAL_COMMUNITY)
Admission: AD | Admit: 2017-08-21 | Discharge: 2017-08-21 | Disposition: A | Discharge status: Home / Self Care | Payer: Medicaid Other | Source: Ambulatory Visit | Attending: Obstetrics & Gynecology | Admitting: Obstetrics & Gynecology

## 2017-08-21 ENCOUNTER — Telehealth: Payer: Self-pay | Admitting: Advanced Practice Midwife

## 2017-08-21 ENCOUNTER — Inpatient Hospital Stay (HOSPITAL_COMMUNITY): Payer: Medicaid Other

## 2017-08-21 DIAGNOSIS — R109 Unspecified abdominal pain: Secondary | ICD-10-CM | POA: Insufficient documentation

## 2017-08-21 DIAGNOSIS — Z3A09 9 weeks gestation of pregnancy: Secondary | ICD-10-CM | POA: Diagnosis not present

## 2017-08-21 DIAGNOSIS — O210 Mild hyperemesis gravidarum: Secondary | ICD-10-CM | POA: Insufficient documentation

## 2017-08-21 DIAGNOSIS — O219 Vomiting of pregnancy, unspecified: Secondary | ICD-10-CM

## 2017-08-21 DIAGNOSIS — O26899 Other specified pregnancy related conditions, unspecified trimester: Secondary | ICD-10-CM

## 2017-08-21 LAB — URINALYSIS, ROUTINE W REFLEX MICROSCOPIC
Bilirubin Urine: NEGATIVE
Glucose, UA: NEGATIVE mg/dL
Hgb urine dipstick: NEGATIVE
Ketones, ur: NEGATIVE mg/dL
Leukocytes, UA: NEGATIVE
Nitrite: NEGATIVE
Protein, ur: NEGATIVE mg/dL
Specific Gravity, Urine: 1.017 (ref 1.005–1.030)
pH: 6 (ref 5.0–8.0)

## 2017-08-21 LAB — CBC
HCT: 36.7 % (ref 36.0–46.0)
Hemoglobin: 12.8 g/dL (ref 12.0–15.0)
MCH: 30.5 pg (ref 26.0–34.0)
MCHC: 34.9 g/dL (ref 30.0–36.0)
MCV: 87.4 fL (ref 78.0–100.0)
PLATELETS: 215 10*3/uL (ref 150–400)
RBC: 4.2 MIL/uL (ref 3.87–5.11)
RDW: 12 % (ref 11.5–15.5)
WBC: 11.1 10*3/uL — AB (ref 4.0–10.5)

## 2017-08-21 LAB — POCT PREGNANCY, URINE: Preg Test, Ur: POSITIVE — AB

## 2017-08-21 LAB — HCG, QUANTITATIVE, PREGNANCY: hCG, Beta Chain, Quant, S: 223930 m[IU]/mL — ABNORMAL HIGH (ref <5)

## 2017-08-21 LAB — ABO/RH: ABO/RH(D): B POS

## 2017-08-21 MED ORDER — METOCLOPRAMIDE HCL 10 MG PO TABS
10.0000 mg | ORAL_TABLET | Freq: Once | ORAL | Status: AC
  Administered 2017-08-21: 10 mg via ORAL
  Filled 2017-08-21: qty 1

## 2017-08-21 MED ORDER — METOCLOPRAMIDE HCL 10 MG PO TABS: 10.0000 mg | ORAL_TABLET | Freq: Three times a day (TID) | ORAL | 1 refills | Status: DC

## 2017-08-21 NOTE — Discharge Instructions (Signed)

## 2017-08-21 NOTE — Telephone Encounter (Signed)
Pt called with questions about her ultrasound today in MAU with a subchorionic hemorrhage. Reviewed US with pt, with small SCH and normal IUP at [id number].  [facility] but pt should return to [facility] with any severe pain or heavy bleeding.  Pt states understanding and plans to begin prenatal care.

## 2017-08-21 NOTE — MAU Provider Note (Signed)
History     CSN: [id number]  Arrival date and time: 08/21/17 1521   First Provider Initiated Contact with Patient 08/21/17 1712      Chief Complaint  Patient presents with  . Loss of Consciousness  . Abdominal Pain   HPI   States she was eating with her daughter, and then felt nauseous. States she went to vomit and when she stood up to brush her teeth she felt dizzy. She went and sat down the dizziness did not go. Has had bad nausea and vomiting, has not taken anything for the symptoms. In the last 24 hours she has vomited once. She was able to eat breakfast and then had soup around 1400. She has been drinking cranberry juice. She did not pass out or fall.  States she would like something for nausea. States she has had off and on lower abdominal pain. The pain is infrequent.   OB History    Gravida Para Term Preterm AB Living   2 1 1     1    SAB TAB Ectopic Multiple Live Births         0 1      Past Medical History:  Diagnosis Date  . Asthma   . Infection     Past Surgical History:  Procedure Laterality Date  . NO PAST SURGERIES      History reviewed. No pertinent family history.  Social History   Tobacco Use  . Smoking status: Never Smoker  . Smokeless tobacco: Never Used  Substance Use Topics  . Alcohol use: No  . Drug use: No    Allergies: No Known Allergies  Medications Prior to Admission  Medication Sig Dispense Refill Last Dose  . butalbital-acetaminophen-caffeine (FIORICET, ESGIC) 50-325-40 MG tablet Take 1-2 tablets by mouth every 6 (six) hours as needed for headache. (Patient not taking: Reported on 08/21/2017) 20 tablet 0 Not Taking at Unknown time   Results for orders placed or performed during the hospital encounter of 08/21/17 (from the past 48 hour(s))  Urinalysis, Routine w reflex microscopic     Status: Abnormal   Collection Time: 08/21/17  3:45 PM  Result Value Ref Range   Color, Urine YELLOW YELLOW   APPearance HAZY (A) CLEAR   Specific  Gravity, Urine 1.017 1.005 - 1.030   pH 6.0 5.0 - 8.0   Glucose, UA NEGATIVE NEGATIVE mg/dL   Hgb urine dipstick NEGATIVE NEGATIVE   Bilirubin Urine NEGATIVE NEGATIVE   Ketones, ur NEGATIVE NEGATIVE mg/dL   Protein, ur NEGATIVE NEGATIVE mg/dL   Nitrite NEGATIVE NEGATIVE   Leukocytes, UA NEGATIVE NEGATIVE  Pregnancy, urine POC     Status: Abnormal   Collection Time: 08/21/17  3:57 PM  Result Value Ref Range   Preg Test, Ur POSITIVE (A) NEGATIVE    Comment:        THE SENSITIVITY OF THIS METHODOLOGY IS >24 mIU/mL   CBC     Status: Abnormal   Collection Time: 08/21/17  5:24 PM  Result Value Ref Range   WBC 11.1 (H) 4.0 - 10.5 K/uL   RBC 4.20 3.87 - 5.11 MIL/uL   Hemoglobin 12.8 12.0 - 15.0 g/dL   HCT 40.936.7 81.136.0 - 91.446.0 %   MCV 87.4 78.0 - 100.0 fL   MCH 30.5 26.0 - 34.0 pg   MCHC 34.9 30.0 - 36.0 g/dL   RDW 78.212.0 95.611.5 - 21.315.5 %   Platelets 215 150 - 400 K/uL  HIV antibody     Status:  None   Collection Time: 08/21/17  5:24 PM  Result Value Ref Range   HIV Screen 4th Generation wRfx Non Reactive Non Reactive    Comment: (NOTE) Performed At: [facility] [address] [place], [place] [id number] Jolene SchimkeNagendra Sanjai MD WU:[id number]:[phone]   ABO/Rh     Status: None   Collection Time: 08/21/17  5:25 PM  Result Value Ref Range   ABO/RH(D) B POS   hCG, quantitative, pregnancy     Status: Abnormal   Collection Time: 08/21/17  5:25 PM  Result Value Ref Range   hCG, Beta Chain, Quant, S 223,930 (H) <5 mIU/mL    Comment:          GEST. AGE      CONC.  (mIU/mL)   <=1 WEEK        5 - 50     2 WEEKS       50 - 500     3 WEEKS       100 - 10,000     4 WEEKS     1,000 - 30,000     5 WEEKS     3,500 - 115,000   6-8 WEEKS     12,000 - 270,000    12 WEEKS     15,000 - 220,000        FEMALE AND NON-PREGNANT FEMALE:     LESS THAN 5 mIU/mL    [place] Ob Comp Less 14 Wks  Result Date: 08/21/2017 CLINICAL DATA:  Abdominal pain EXAM: OBSTETRIC <14 WK ULTRASOUND TECHNIQUE: Transabdominal  ultrasound was performed for evaluation of the gestation as well as the maternal uterus and adnexal regions. COMPARISON:  None. FINDINGS: Intrauterine gestational sac: Single Yolk sac:  Visualized Embryo:  Visualized Cardiac Activity: Visualized Heart Rate: 180 bpm MSD:   mm    w     d CRL:   29.3  mm   9 w 5 d                  US EDC: 03/21/2018 Subchorionic hemorrhage:  Small subchorionic hemorrhage Maternal uterus/adnexae: No adnexal mass.  Trace free fluid. IMPRESSION: Nine week 5 day intrauterine pregnancy. Fetal heart rate 180 beats per minute. Small subchorionic hemorrhage. Electronically Signed   By: Charlett NoseKevin  Dover M.D.   On: 08/21/2017 18:43   Review of Systems  Constitutional: Negative for fever.  Gastrointestinal: Positive for abdominal pain.  Genitourinary: Negative for dysuria and vaginal bleeding.   Physical Exam   Blood pressure (!) 111/58, pulse 87, temperature 97.7 F (36.5 C), temperature source Oral, resp. rate 16, weight 138 lb 8 oz (62.8 kg), last menstrual period 06/22/2017, not currently breastfeeding.  Physical Exam  Constitutional: She is oriented to person, place, and time. She appears well-developed and well-nourished. No distress.  HENT:  Head: Normocephalic.  Eyes: Pupils are equal, round, and reactive to light.  Neck: Neck supple.  GI: Soft. She exhibits no distension. There is no tenderness. There is no rebound and no guarding.  Musculoskeletal: Normal range of motion.  Neurological: She is alert and oriented to person, place, and time.  Skin: Skin is warm.  Psychiatric: Her behavior is normal.   MAU Course  Procedures  MDM  Reglan 10 mg PO given HIV, CBC, Hcg, ABO US OB transvaginal   Assessment and Plan   A:  1. Nausea and vomiting in pregnancy   2. Abdominal pain in pregnancy, antepartum     P:  Discharge home in stable condition  [place] looks good today Rx: Reglan Start prenatal care Return to MAU if symptoms worsen    Nigil Braman, Harolyn Rutherford,  NP 08/23/2017 9:33 AM

## 2017-08-21 NOTE — MAU Note (Signed)
Pt states that she was eating at 1400 and started feeling sick. She then went to the restroom to vomit. After, she felt like she was going to faint and she also had lower abdominal pain. She did not actually faint she just had the feeling of fainting.   She was rating the pain 9/10 constant, but now she states she has no pain.   Denies vaginal bleeding.

## 2017-08-22 LAB — HIV ANTIBODY (ROUTINE TESTING W REFLEX): HIV Screen 4th Generation wRfx: NONREACTIVE

## 2017-09-03 NOTE — L&D Delivery Note (Signed)
Delivery Note At  a viable female was delivered via  (Presentation:OA ;  ).  APGAR:8 ,9 ; weight pending  .   Placenta status:complete , .3V  Cord:  with the following complications:nuchal cord x1 loose .   Anesthesia:  Epidural Episiotomy:  None Lacerations:  right labial Suture Repair: 4-0 vicryl Est. Blood Loss (mL):    Mom to postpartum.  Baby to Couplet care / Skin to Skin.  Kaitlyn Hardin 03/25/2018, 10:26 PM

## 2017-09-15 ENCOUNTER — Inpatient Hospital Stay (HOSPITAL_COMMUNITY)
Admission: AD | Admit: 2017-09-15 | Discharge: 2017-09-15 | Disposition: A | Discharge status: Home / Self Care | Payer: Medicaid Other | Source: Ambulatory Visit | Attending: Obstetrics & Gynecology | Admitting: Obstetrics & Gynecology

## 2017-09-15 ENCOUNTER — Encounter (HOSPITAL_COMMUNITY): Payer: Self-pay | Admitting: *Deleted

## 2017-09-15 DIAGNOSIS — Z3A13 13 weeks gestation of pregnancy: Secondary | ICD-10-CM | POA: Diagnosis not present

## 2017-09-15 DIAGNOSIS — R55 Syncope and collapse: Secondary | ICD-10-CM | POA: Diagnosis not present

## 2017-09-15 DIAGNOSIS — O26891 Other specified pregnancy related conditions, first trimester: Secondary | ICD-10-CM | POA: Insufficient documentation

## 2017-09-15 DIAGNOSIS — R42 Dizziness and giddiness: Secondary | ICD-10-CM | POA: Insufficient documentation

## 2017-09-15 LAB — CBC
HEMATOCRIT: 34.5 % — AB (ref 36.0–46.0)
HEMOGLOBIN: 11.9 g/dL — AB (ref 12.0–15.0)
MCH: 30 pg (ref 26.0–34.0)
MCHC: 34.5 g/dL (ref 30.0–36.0)
MCV: 86.9 fL (ref 78.0–100.0)
Platelets: 219 10*3/uL (ref 150–400)
RBC: 3.97 MIL/uL (ref 3.87–5.11)
RDW: 12 % (ref 11.5–15.5)
WBC: 7.4 10*3/uL (ref 4.0–10.5)

## 2017-09-15 LAB — URINALYSIS, ROUTINE W REFLEX MICROSCOPIC
Bilirubin Urine: NEGATIVE
GLUCOSE, UA: NEGATIVE mg/dL
Hgb urine dipstick: NEGATIVE
KETONES UR: NEGATIVE mg/dL
LEUKOCYTES UA: NEGATIVE
NITRITE: NEGATIVE
PH: 7 (ref 5.0–8.0)
Protein, ur: NEGATIVE mg/dL
SPECIFIC GRAVITY, URINE: 1.019 (ref 1.005–1.030)

## 2017-09-15 LAB — COMPREHENSIVE METABOLIC PANEL
ALK PHOS: 51 U/L (ref 38–126)
ALT: 9 U/L — AB (ref 14–54)
ANION GAP: 9 (ref 5–15)
AST: 19 U/L (ref 15–41)
Albumin: 3.6 g/dL (ref 3.5–5.0)
BILIRUBIN TOTAL: 0.6 mg/dL (ref 0.3–1.2)
BUN: 7 mg/dL (ref 6–20)
CALCIUM: 8.9 mg/dL (ref 8.9–10.3)
CO2: 20 mmol/L — AB (ref 22–32)
CREATININE: 0.39 mg/dL — AB (ref 0.44–1.00)
Chloride: 106 mmol/L (ref 101–111)
GFR calc non Af Amer: >60 mL/min (ref >60)
GLUCOSE: 86 mg/dL (ref 65–99)
Potassium: 4 mmol/L (ref 3.5–5.1)
SODIUM: 135 mmol/L (ref 135–145)
TOTAL PROTEIN: 6.7 g/dL (ref 6.5–8.1)

## 2017-09-15 LAB — GLUCOSE, CAPILLARY: Glucose-Capillary: 94 mg/dL (ref 65–99)

## 2017-09-15 NOTE — Discharge Instructions (Signed)
Dizziness °Dizziness is a common problem. It is a feeling of unsteadiness or light-headedness. You may feel like you are about to faint. Dizziness can lead to injury if you stumble or fall. Anyone can become dizzy, but dizziness is more common in older adults. This condition can be caused by a number of things, including medicines, dehydration, or illness. °Follow these instructions at home: °Eating and drinking °· Drink enough fluid to keep your urine clear or pale yellow. This helps to keep you from becoming dehydrated. Try to drink more clear fluids, such as water. °· Do not drink alcohol. °· Limit your caffeine intake if told to do so by your health care provider. Check ingredients and nutrition facts to see if a food or beverage contains caffeine. °· Limit your salt (sodium) intake if told to do so by your health care provider. Check ingredients and nutrition facts to see if a food or beverage contains sodium. °Activity °· Avoid making quick movements. °? Rise slowly from chairs and steady yourself until you feel okay. °? In the morning, first sit up on the side of the bed. When you feel okay, stand slowly while you hold onto something until you know that your balance is fine. °· If you need to stand in one place for a long time, move your legs often. Tighten and relax the muscles in your legs while you are standing. °· Do not drive or use heavy machinery if you feel dizzy. °· Avoid bending down if you feel dizzy. Place items in your home so that they are easy for you to reach without leaning over. °Lifestyle °· Do not use any products that contain nicotine or tobacco, such as cigarettes and e-cigarettes. If you need help quitting, ask your health care provider. °· Try to reduce your stress level by using methods such as yoga or meditation. Talk with your health care provider if you need help to manage your stress. °General instructions °· Watch your dizziness for any changes. °· Take over-the-counter and  prescription medicines only as told by your health care provider. Talk with your health care provider if you think that your dizziness is caused by a medicine that you are taking. °· Tell a friend or a family member that you are feeling dizzy. If he or she notices any changes in your behavior, have this person call your health care provider. °· Keep all follow-up visits as told by your health care provider. This is important. °Contact a health care provider if: °· Your dizziness does not go away. °· Your dizziness or light-headedness gets worse. °· You feel nauseous. °· You have reduced hearing. °· You have new symptoms. °· You are unsteady on your feet or you feel like the room is spinning. °Get help right away if: °· You vomit or have diarrhea and are unable to eat or drink anything. °· You have problems talking, walking, swallowing, or using your arms, hands, or legs. °· You feel generally weak. °· You are not thinking clearly or you have trouble forming sentences. It may take a friend or family member to notice this. °· You have chest pain, abdominal pain, shortness of breath, or sweating. °· Your vision changes. °· You have any bleeding. °· You have a severe headache. °· You have neck pain or a stiff neck. °· You have a fever. °These symptoms may represent a serious problem that is an emergency. Do not wait to see if the symptoms will go away. Get medical help   right away. Call your local emergency services (911 in the [place] Do not drive yourself to the hospital. °Summary °· Dizziness is a feeling of unsteadiness or light-headedness. This condition can be caused by a number of things, including medicines, dehydration, or illness. °· Anyone can become dizzy, but dizziness is more common in older adults. °· Drink enough fluid to keep your urine clear or pale yellow. Do not drink alcohol. °· Avoid making quick movements if you feel dizzy. Monitor your dizziness for any changes. °This information is not intended to  replace advice given to you by your health care provider. Make sure you discuss any questions you have with your health care provider. °Document Released: 02/13/2001 Document Revised: 09/22/2016 Document Reviewed: 09/22/2016 °Elsevier Interactive Patient Education © 2018 [employer] ° °

## 2017-09-15 NOTE — MAU Note (Signed)
Pt states she felt like her heart was beating fast and she got short of breath and then she said she blacked out.  Stated this happened twice yesterday.  Felt like this around 1600 today as well.  Denies LOF/VB/discharge. States she feels weak.

## 2017-09-15 NOTE — MAU Provider Note (Signed)
History     CSN: [id number]  Arrival date and time: 09/15/17 1559   First Provider Initiated Contact with Patient 09/15/17 1721      Chief Complaint  Patient presents with  . Near Syncope  . Tachycardia   HPI Kaitlyn Hardin is a 22 y.o. G2P1001 at [redacted]w[redacted]d who presents with dizziness & syncope. This has been a recurrent issue since the beginning of December & patient was seen for same complaint in MAU on 12/19. States yesterday she felt like she passed out twice. This afternoon while she was cleaning the house she felt like her heart was racing & she got dizzy & nearly passed out but denies LOC. Has eaten one meal today & had 1 bottle of water. Denies cardiac history. Denies recent head trauma. Denies abdominal pain or vaginal bleeding. Has not started prenatal care.   OB History    Gravida Para Term Preterm AB Living   2 1 1     1    SAB TAB Ectopic Multiple Live Births         0 1      Past Medical History:  Diagnosis Date  . Asthma   . Infection     Past Surgical History:  Procedure Laterality Date  . NO PAST SURGERIES      History reviewed. No pertinent family history.  Social History   Tobacco Use  . Smoking status: Never Smoker  . Smokeless tobacco: Never Used  Substance Use Topics  . Alcohol use: No  . Drug use: No    Allergies: No Known Allergies  Medications Prior to Admission  Medication Sig Dispense Refill Last Dose  . metoCLOPramide (REGLAN) 10 MG tablet Take 1 tablet (10 mg total) by mouth 4 (four) times daily -  before meals and at bedtime. 60 tablet 1 Past Week at Unknown time    Review of Systems  Constitutional: Negative.   Respiratory: Negative for cough and shortness of breath.   Cardiovascular: Negative for chest pain and palpitations.  Gastrointestinal: Negative.   Genitourinary: Negative.   Neurological: Positive for dizziness and syncope. Negative for headaches.   Physical Exam   Blood pressure 119/67, pulse 97, temperature 97.6 F  (36.4 C), temperature source Oral, resp. rate 16, last menstrual period 06/22/2017, SpO2 100 %.   Physical Exam  Nursing note and vitals reviewed. Constitutional: She is oriented to person, place, and time. She appears well-developed and well-nourished. No distress.  HENT:  Head: Normocephalic and atraumatic.  Eyes: Conjunctivae are normal. Right eye exhibits no discharge. Left eye exhibits no discharge. No scleral icterus.  Neck: Normal range of motion.  Cardiovascular: Normal rate, regular rhythm and normal heart sounds.  No murmur heard. Respiratory: Effort normal and breath sounds normal. No respiratory distress. She has no wheezes.  GI: Soft. There is no tenderness.  Neurological: She is alert and oriented to person, place, and time.  Skin: Skin is warm and dry. She is not diaphoretic.  Psychiatric: She has a normal mood and affect. Her behavior is normal. Judgment and thought content normal.    MAU Course  Procedures Results for orders placed or performed during the hospital encounter of 09/15/17 (from the past 24 hour(s))  Urinalysis, Routine w reflex microscopic     Status: Abnormal   Collection Time: 09/15/17  4:06 PM  Result Value Ref Range   Color, Urine YELLOW YELLOW   APPearance CLOUDY (A) CLEAR   Specific Gravity, Urine 1.019 1.005 - 1.030   pH  7.0 5.0 - 8.0   Glucose, UA NEGATIVE NEGATIVE mg/dL   Hgb urine dipstick NEGATIVE NEGATIVE   Bilirubin Urine NEGATIVE NEGATIVE   Ketones, ur NEGATIVE NEGATIVE mg/dL   Protein, ur NEGATIVE NEGATIVE mg/dL   Nitrite NEGATIVE NEGATIVE   Leukocytes, UA NEGATIVE NEGATIVE  CBC     Status: Abnormal   Collection Time: 09/15/17  4:41 PM  Result Value Ref Range   WBC 7.4 4.0 - 10.5 K/uL   RBC 3.97 3.87 - 5.11 MIL/uL   Hemoglobin 11.9 (L) 12.0 - 15.0 g/dL   HCT 63.834.5 (L) 75.636.0 - 43.346.0 %   MCV 86.9 78.0 - 100.0 fL   MCH 30.0 26.0 - 34.0 pg   MCHC 34.5 30.0 - 36.0 g/dL   RDW 29.512.0 18.811.5 - 41.615.5 %   Platelets 219 150 - 400 K/uL   Comprehensive metabolic panel     Status: Abnormal   Collection Time: 09/15/17  4:41 PM  Result Value Ref Range   Sodium 135 135 - 145 mmol/L   Potassium 4.0 3.5 - 5.1 mmol/L   Chloride 106 101 - 111 mmol/L   CO2 20 (L) 22 - 32 mmol/L   Glucose, Bld 86 65 - 99 mg/dL   BUN 7 6 - 20 mg/dL   Creatinine, Ser 6.060.39 (L) 0.44 - 1.00 mg/dL   Calcium 8.9 8.9 - 30.110.3 mg/dL   Total Protein 6.7 6.5 - 8.1 g/dL   Albumin 3.6 3.5 - 5.0 g/dL   AST 19 15 - 41 U/L   ALT 9 (L) 14 - 54 U/L   Alkaline Phosphatase 51 38 - 126 U/L   Total Bilirubin 0.6 0.3 - 1.2 mg/dL   GFR calc non Af Amer >60 >60 mL/min   GFR calc Af Amer >60 >60 mL/min   Anion gap 9 5 - 15  Glucose, capillary     Status: None   Collection Time: 09/15/17  5:10 PM  Result Value Ref Range   Glucose-Capillary 94 65 - 99 mg/dL    MDM CBC, CMP, orthostatic VS, EKG, CBG Normal EKG, hemoglobin stable, VSS, NAD  Assessment and Plan  A: 1. Dizziness   2. Pre-syncope    P: Discharge home Increase water intake. Eat every 2-3 hours. Slow position changes Discussed reasons to return to MAU Schedule prenatal care  Judeth Hornrin Ronn Smolinsky 09/15/2017, 5:21 PM

## 2017-09-20 ENCOUNTER — Other Ambulatory Visit: Payer: Self-pay

## 2017-09-20 ENCOUNTER — Inpatient Hospital Stay (HOSPITAL_COMMUNITY)
Admission: AD | Admit: 2017-09-20 | Discharge: 2017-09-20 | Disposition: A | Discharge status: Home / Self Care | Payer: Medicaid Other | Source: Ambulatory Visit | Attending: Obstetrics and Gynecology | Admitting: Obstetrics and Gynecology

## 2017-09-20 ENCOUNTER — Encounter (HOSPITAL_COMMUNITY): Payer: Self-pay | Admitting: *Deleted

## 2017-09-20 DIAGNOSIS — B9620 Unspecified Escherichia coli [E. coli] as the cause of diseases classified elsewhere: Secondary | ICD-10-CM | POA: Insufficient documentation

## 2017-09-20 DIAGNOSIS — N3000 Acute cystitis without hematuria: Secondary | ICD-10-CM | POA: Diagnosis not present

## 2017-09-20 DIAGNOSIS — Z3A14 14 weeks gestation of pregnancy: Secondary | ICD-10-CM | POA: Insufficient documentation

## 2017-09-20 DIAGNOSIS — O2312 Infections of bladder in pregnancy, second trimester: Secondary | ICD-10-CM | POA: Diagnosis not present

## 2017-09-20 DIAGNOSIS — Z809 Family history of malignant neoplasm, unspecified: Secondary | ICD-10-CM | POA: Diagnosis not present

## 2017-09-20 DIAGNOSIS — R109 Unspecified abdominal pain: Secondary | ICD-10-CM | POA: Diagnosis present

## 2017-09-20 DIAGNOSIS — R300 Dysuria: Secondary | ICD-10-CM | POA: Diagnosis present

## 2017-09-20 LAB — URINALYSIS, ROUTINE W REFLEX MICROSCOPIC
Bilirubin Urine: NEGATIVE
Glucose, UA: NEGATIVE mg/dL
Ketones, ur: 80 mg/dL — AB
Nitrite: POSITIVE — AB
Protein, ur: 100 mg/dL — AB
Specific Gravity, Urine: 1.014 (ref 1.005–1.030)
pH: 5 (ref 5.0–8.0)

## 2017-09-20 MED ORDER — CEPHALEXIN 500 MG PO CAPS: 500.0000 mg | ORAL_CAPSULE | Freq: Four times a day (QID) | ORAL | 0 refills | Status: DC

## 2017-09-20 MED ORDER — ACETAMINOPHEN 500 MG PO TABS
1000.0000 mg | ORAL_TABLET | Freq: Once | ORAL | Status: AC
  Administered 2017-09-20: 1000 mg via ORAL
  Filled 2017-09-20: qty 2

## 2017-09-20 MED ORDER — SULFAMETHOXAZOLE-TRIMETHOPRIM 800-160 MG PO TABS: 1.0000 | ORAL_TABLET | Freq: Two times a day (BID) | ORAL | 0 refills | Status: DC

## 2017-09-20 MED ORDER — PHENAZOPYRIDINE HCL 200 MG PO TABS: 200.0000 mg | ORAL_TABLET | Freq: Three times a day (TID) | ORAL | 0 refills | Status: AC

## 2017-09-20 NOTE — Discharge Instructions (Signed)

## 2017-09-20 NOTE — MAU Provider Note (Signed)
History     CSN: [id number]  Arrival date and time: 09/20/17 1307    First Provider Initiated Contact with Patient 09/20/17 1405     Chief Complaint  Patient presents with  . Abdominal Pain  . Dysuria   HPI Kaitlyn Hardin is a 22 y.o. G2P1001 at [redacted]w[redacted]d who presents with lower abdominal and back pain for 2 days. She states the pain is aching and comes and goes. She rates the pain a 6/10 and has not tried anything for the pain. She reports dysuria and frequency today. She denies any vaginal bleeding or discharge.   OB History    Gravida Para Term Preterm AB Living   2 1 1     1    SAB TAB Ectopic Multiple Live Births         0 1      Past Medical History:  Diagnosis Date  . Asthma    hx of    Past Surgical History:  Procedure Laterality Date  . NO PAST SURGERIES      Family History  Problem Relation Age of Onset  . Cancer Maternal Grandfather   . Cancer Paternal Grandfather     Social History   Tobacco Use  . Smoking status: Never Smoker  . Smokeless tobacco: Never Used  Substance Use Topics  . Alcohol use: No  . Drug use: No    Allergies: No Known Allergies  Medications Prior to Admission  Medication Sig Dispense Refill Last Dose  . metoCLOPramide (REGLAN) 10 MG tablet Take 1 tablet (10 mg total) by mouth 4 (four) times daily -  before meals and at bedtime. 60 tablet 1 Past Week at Unknown time    Review of Systems  Constitutional: Negative.  Negative for fatigue and fever.  HENT: Negative.   Respiratory: Negative.  Negative for shortness of breath.   Cardiovascular: Negative.  Negative for chest pain.  Gastrointestinal: Positive for abdominal pain. Negative for constipation, diarrhea, nausea and vomiting.  Genitourinary: Positive for dysuria and frequency.  Musculoskeletal: Positive for back pain.  Neurological: Negative.  Negative for dizziness and headaches.   Physical Exam   Blood pressure (!) 101/59, pulse 99, temperature 97.8 F (36.6 C),  temperature source Oral, resp. rate 18, weight 134 lb 8 oz (61 kg), last menstrual period 06/22/2017, SpO2 100 %.  Physical Exam  Nursing note and vitals reviewed. Constitutional: She is oriented to person, place, and time. She appears well-developed and well-nourished. No distress.  HENT:  Head: Normocephalic.  Eyes: Pupils are equal, round, and reactive to light.  Cardiovascular: Normal rate, regular rhythm and normal heart sounds.  Respiratory: Effort normal and breath sounds normal. No respiratory distress.  GI: Soft. Bowel sounds are normal. She exhibits no distension. There is no tenderness. There is CVA tenderness.  Neurological: She is alert and oriented to person, place, and time.  Skin: Skin is warm and dry.  Psychiatric: She has a normal mood and affect. Her behavior is normal. Judgment and thought content normal.   Dilation: Closed Effacement (%): Thick Cervical Position: Posterior Exam by:: Ma Hillock CNM  MAU Course  Procedures Results for orders placed or performed during the hospital encounter of 09/20/17 (from the past 24 hour(s))  Urinalysis, Routine w reflex microscopic     Status: Abnormal   Collection Time: 09/20/17  1:15 PM  Result Value Ref Range   Color, Urine AMBER (A) YELLOW   APPearance TURBID (A) CLEAR   Specific Gravity, Urine 1.014 1.005 -  1.030   pH 5.0 5.0 - 8.0   Glucose, UA NEGATIVE NEGATIVE mg/dL   Hgb urine dipstick SMALL (A) NEGATIVE   Bilirubin Urine NEGATIVE NEGATIVE   Ketones, ur 80 (A) NEGATIVE mg/dL   Protein, ur 540100 (A) NEGATIVE mg/dL   Nitrite POSITIVE (A) NEGATIVE   Leukocytes, UA LARGE (A) NEGATIVE   RBC / HPF 6-30 0 - 5 RBC/hpf   WBC, UA TOO NUMEROUS TO COUNT 0 - 5 WBC/hpf   Bacteria, UA RARE (A) NONE SEEN   Squamous Epithelial / LPF 0-5 (A) NONE SEEN   WBC Clumps PRESENT    Mucus PRESENT     MDM UA  Assessment and Plan   1. Acute cystitis without hematuria   2. [redacted] weeks gestation of pregnancy    -Discharge home in  stable condition -Rx for keflex and pyridium sent to patient's pharmacy -pyelonephritis precautions discussed -Patient advised to follow-up with OB of choice to begin prenatal care -Patient may return to MAU as needed or if her condition were to change or worsen   Rolm BookbinderCaroline M Autum Benfer CNM 09/20/2017, 2:43 PM

## 2017-09-20 NOTE — MAU Note (Addendum)
Pain in lower abd started yesterday. Kind of feels like contractions. No bleeding. [place]. Having frequency and urgency with urination, but then nothing comes out.  Having back pain, +L CVA tenderness

## 2017-09-22 LAB — CULTURE, OB URINE

## 2017-10-29 LAB — OB RESULTS CONSOLE HEPATITIS B SURFACE ANTIGEN: Hepatitis B Surface Ag: NEGATIVE

## 2017-10-29 LAB — OB RESULTS CONSOLE RPR: RPR: NONREACTIVE

## 2017-10-29 LAB — OB RESULTS CONSOLE GC/CHLAMYDIA
CHLAMYDIA, DNA PROBE: NEGATIVE
GC PROBE AMP, GENITAL: NEGATIVE

## 2017-10-29 LAB — OB RESULTS CONSOLE RUBELLA ANTIBODY, IGM: Rubella: IMMUNE

## 2017-10-29 LAB — OB RESULTS CONSOLE HIV ANTIBODY (ROUTINE TESTING): HIV: NONREACTIVE

## 2018-01-06 ENCOUNTER — Inpatient Hospital Stay (HOSPITAL_COMMUNITY)
Admission: AD | Admit: 2018-01-06 | Discharge: 2018-01-06 | Disposition: A | Discharge status: Home / Self Care | Payer: Medicaid Other | Source: Ambulatory Visit | Attending: Obstetrics and Gynecology | Admitting: Obstetrics and Gynecology

## 2018-01-06 ENCOUNTER — Encounter (HOSPITAL_COMMUNITY): Payer: Self-pay | Admitting: *Deleted

## 2018-01-06 DIAGNOSIS — Z3A29 29 weeks gestation of pregnancy: Secondary | ICD-10-CM | POA: Diagnosis not present

## 2018-01-06 DIAGNOSIS — O26893 Other specified pregnancy related conditions, third trimester: Secondary | ICD-10-CM | POA: Insufficient documentation

## 2018-01-06 DIAGNOSIS — R1030 Lower abdominal pain, unspecified: Secondary | ICD-10-CM | POA: Insufficient documentation

## 2018-01-06 DIAGNOSIS — N949 Unspecified condition associated with female genital organs and menstrual cycle: Secondary | ICD-10-CM

## 2018-01-06 LAB — URINALYSIS, ROUTINE W REFLEX MICROSCOPIC
Bilirubin Urine: NEGATIVE
Glucose, UA: NEGATIVE mg/dL
Hgb urine dipstick: NEGATIVE
KETONES UR: 20 mg/dL — AB
Nitrite: NEGATIVE
PH: 7 (ref 5.0–8.0)
Protein, ur: NEGATIVE mg/dL
SPECIFIC GRAVITY, URINE: 1.018 (ref 1.005–1.030)

## 2018-01-06 MED ORDER — ACETAMINOPHEN 325 MG PO TABS
650.0000 mg | ORAL_TABLET | Freq: Once | ORAL | Status: AC
  Administered 2018-01-06: 650 mg via ORAL
  Filled 2018-01-06: qty 2

## 2018-01-06 NOTE — MAU Note (Signed)
Pt reports [place] on both sides of her abd and lower back. Not sure if they are contractions. Pain can be constant at time and she was unable to sleep last night

## 2018-01-06 NOTE — MAU Provider Note (Signed)
  History     CSN: [id number]  Arrival date and time: 01/06/18 1244   None     Chief Complaint  Patient presents with  . Abdominal Pain   Kaitlyn Hardin 22 y.o.G2P1001  [redacted]w[redacted]d presents for lower abdominal pain. Denies loss of fluid, vaginal bleeding, reports + fm.  Rn note- Pt reports [place] on both sides of her abd and lower back. Not sure if they are contractions. Pain can be constant at time and she was unable to sleep last night              Past Medical History:  Diagnosis Date  . Asthma    hx of    Past Surgical History:  Procedure Laterality Date  . NO PAST SURGERIES      Family History  Problem Relation Age of Onset  . Cancer Maternal Grandfather   . Cancer Paternal Grandfather     Social History   Tobacco Use  . Smoking status: Never Smoker  . Smokeless tobacco: Never Used  Substance Use Topics  . Alcohol use: No  . Drug use: No    Allergies: No Known Allergies  Medications Prior to Admission  Medication Sig Dispense Refill Last Dose  . albuterol (PROVENTIL HFA;VENTOLIN HFA) 108 (90 Base) MCG/ACT inhaler Inhale 1-2 puffs into the lungs every 6 (six) hours as needed for wheezing or shortness of breath.   Rescue  . docusate sodium (COLACE) 100 MG capsule Take 100 mg by mouth 2 (two) times daily as needed for mild constipation.   01/05/2018 at Unknown time  . ferrous sulfate 325 (65 FE) MG tablet Take 325 mg by mouth daily with breakfast.   01/05/2018 at Unknown time  . cephALEXin (KEFLEX) 500 MG capsule Take 1 capsule (500 mg total) by mouth 4 (four) times daily. (Patient not taking: Reported on 01/06/2018) 20 capsule 0 Completed Course at Unknown time  . metoCLOPramide (REGLAN) 10 MG tablet Take 1 tablet (10 mg total) by mouth 4 (four) times daily -  before meals and at bedtime. (Patient not taking: Reported on 01/06/2018) 60 tablet 1 Not Taking at Unknown time    Review of Systems  Gastrointestinal: Positive for abdominal pain.  All other systems  reviewed and are negative.  Physical Exam   Blood pressure 98/61, pulse (!) 106, temperature 98.4 F (36.9 C), resp. rate 18, height  (1.499 m), weight 140 lb (63.5 kg), last menstrual period 06/22/2017.  Physical Exam  Nursing note and vitals reviewed. Constitutional: She is oriented to person, place, and time. She appears well-developed and well-nourished. No distress.  HENT:  Head: Normocephalic and atraumatic.  Cardiovascular: Normal rate.  Respiratory: Effort normal. No respiratory distress.  GI: Soft. There is no tenderness.  Genitourinary: Vagina normal.  Musculoskeletal: Normal range of motion.  Neurological: She is alert and oriented to person, place, and time.  Skin: Skin is warm and dry.  Psychiatric: She has a normal mood and affect. Her behavior is normal.   Category 1strip No contractions SVe FT/long   Ua- wnl 20 ketones MAU Course  Procedures  MDM EFm and had a reactive strip. She was encouraged to hydrate and given tylenol. She reports that her abdominal pain is gone but continues to have back pain. Her cervix is FT/Long/Firm/High. Plan to discharge and keep next appt.  Assessment and Plan  Round Ligament Pain Tylenol prn Dc to home Keep next scheduled appt  Elmore Guise Clemmons CNM 01/06/2018, 2:49 PM

## 2018-01-24 ENCOUNTER — Inpatient Hospital Stay (HOSPITAL_COMMUNITY)
Admission: AD | Admit: 2018-01-24 | Discharge: 2018-01-24 | Disposition: A | Discharge status: Home / Self Care | Payer: Medicaid Other | Source: Ambulatory Visit | Attending: Obstetrics & Gynecology | Admitting: Obstetrics & Gynecology

## 2018-01-24 ENCOUNTER — Encounter (HOSPITAL_COMMUNITY): Payer: Self-pay | Admitting: *Deleted

## 2018-01-24 ENCOUNTER — Other Ambulatory Visit: Payer: Self-pay

## 2018-01-24 DIAGNOSIS — O368130 Decreased fetal movements, third trimester, not applicable or unspecified: Secondary | ICD-10-CM | POA: Diagnosis not present

## 2018-01-24 DIAGNOSIS — R102 Pelvic and perineal pain: Secondary | ICD-10-CM | POA: Diagnosis not present

## 2018-01-24 DIAGNOSIS — O26893 Other specified pregnancy related conditions, third trimester: Secondary | ICD-10-CM | POA: Diagnosis not present

## 2018-01-24 DIAGNOSIS — Z3689 Encounter for other specified antenatal screening: Secondary | ICD-10-CM

## 2018-01-24 DIAGNOSIS — Z3A32 32 weeks gestation of pregnancy: Secondary | ICD-10-CM | POA: Diagnosis not present

## 2018-01-24 LAB — URINALYSIS, ROUTINE W REFLEX MICROSCOPIC
Bilirubin Urine: NEGATIVE
Glucose, UA: NEGATIVE mg/dL
HGB URINE DIPSTICK: NEGATIVE
KETONES UR: NEGATIVE mg/dL
Nitrite: NEGATIVE
Protein, ur: NEGATIVE mg/dL
Specific Gravity, Urine: 1.018 (ref 1.005–1.030)
pH: 6 (ref 5.0–8.0)

## 2018-01-24 NOTE — Discharge Instructions (Signed)

## 2018-01-24 NOTE — MAU Note (Signed)
Pt presents with c/o no FM since yesterday and lower abdominal pain.  Denies VB or LOF.

## 2018-01-24 NOTE — MAU Provider Note (Signed)
  History     CSN: [id number]  Arrival date and time: 01/24/18 1349   None     Chief Complaint  Patient presents with  . Decreased Fetal Movement  . Abdominal Pain   Patient has not felt fetal movement for two days. She was sent over for fetal monitoring by the office. She states she usually feels movement at night and during the early morning but has not felt the baby moving normally. She has also been having bilateral lower abdominal pain with movement.    Past Medical History:  Diagnosis Date  . Asthma    hx of    Past Surgical History:  Procedure Laterality Date  . NO PAST SURGERIES      Family History  Problem Relation Age of Onset  . Cancer Maternal Grandfather   . Cancer Paternal Grandfather     Social History   Tobacco Use  . Smoking status: Never Smoker  . Smokeless tobacco: Never Used  Substance Use Topics  . Alcohol use: No  . Drug use: No    Allergies: No Known Allergies  Medications Prior to Admission  Medication Sig Dispense Refill Last Dose  . ferrous sulfate 325 (65 FE) MG tablet Take 325 mg by mouth daily with breakfast.   01/23/2018 at Unknown time  . albuterol (PROVENTIL HFA;VENTOLIN HFA) 108 (90 Base) MCG/ACT inhaler Inhale 1-2 puffs into the lungs every 6 (six) hours as needed for wheezing or shortness of breath.   rescue  . cephALEXin (KEFLEX) 500 MG capsule Take 1 capsule (500 mg total) by mouth 4 (four) times daily. (Patient not taking: Reported on 01/06/2018) 20 capsule 0 Completed Course at Unknown time  . metoCLOPramide (REGLAN) 10 MG tablet Take 1 tablet (10 mg total) by mouth 4 (four) times daily -  before meals and at bedtime. (Patient not taking: Reported on 01/06/2018) 60 tablet 1 Not Taking at Unknown time    Review of Systems  All other systems reviewed and are negative.  Physical Exam   Vitals:   01/24/18 1410 01/24/18 1418  Resp:  20  Temp:  98.3 F (36.8 C)  TempSrc:  Oral  SpO2:  97%  Weight: 64.9 kg (143 lb 3 oz)    Height:  (1.499 m)      Physical Exam  Constitutional: She is oriented to person, place, and time. She appears well-developed and well-nourished.  HENT:  Head: Normocephalic.  Eyes: Pupils are equal, round, and reactive to light.  Cardiovascular: Normal rate and regular rhythm.  Respiratory: Effort normal and breath sounds normal.  Musculoskeletal: Normal range of motion.  Neurological: She is alert and oriented to person, place, and time.  Skin: Skin is warm and dry.  Psychiatric: She has a normal mood and affect. Her behavior is normal. Judgment and thought content normal.    MAU Course  MDM NST is reactive, discussed pain is likely round ligament pain and that she can take OTC Tylenol, educated her on kick counts, patient is stable to discharge.   Assessment and Plan  22 y.o. G2P1 at 32 weeks Reactive NST, Category 1  Round ligament pain  Janeece Riggers 01/24/2018, 2:38 PM

## 2018-02-19 LAB — OB RESULTS CONSOLE GBS: STREP GROUP B AG: NEGATIVE

## 2018-03-25 ENCOUNTER — Inpatient Hospital Stay (HOSPITAL_COMMUNITY)
Admission: AD | Admit: 2018-03-25 | Discharge: 2018-03-27 | DRG: 807 | Disposition: A | Discharge status: Home / Self Care | Payer: Medicaid Other | Attending: Obstetrics and Gynecology | Admitting: Obstetrics and Gynecology

## 2018-03-25 ENCOUNTER — Inpatient Hospital Stay (HOSPITAL_COMMUNITY): Payer: Medicaid Other | Admitting: Anesthesiology

## 2018-03-25 ENCOUNTER — Encounter (HOSPITAL_COMMUNITY): Payer: Self-pay | Admitting: *Deleted

## 2018-03-25 DIAGNOSIS — O9902 Anemia complicating childbirth: Principal | ICD-10-CM | POA: Diagnosis present

## 2018-03-25 DIAGNOSIS — D649 Anemia, unspecified: Secondary | ICD-10-CM | POA: Diagnosis present

## 2018-03-25 DIAGNOSIS — Z3A40 40 weeks gestation of pregnancy: Secondary | ICD-10-CM

## 2018-03-25 DIAGNOSIS — Z3483 Encounter for supervision of other normal pregnancy, third trimester: Secondary | ICD-10-CM | POA: Diagnosis present

## 2018-03-25 LAB — CBC
HCT: 33.4 % — ABNORMAL LOW (ref 36.0–46.0)
Hemoglobin: 11.4 g/dL — ABNORMAL LOW (ref 12.0–15.0)
MCH: 30.6 pg (ref 26.0–34.0)
MCHC: 34.1 g/dL (ref 30.0–36.0)
MCV: 89.8 fL (ref 78.0–100.0)
PLATELETS: 147 10*3/uL — AB (ref 150–400)
RBC: 3.72 MIL/uL — ABNORMAL LOW (ref 3.87–5.11)
RDW: 13.2 % (ref 11.5–15.5)
WBC: 9 10*3/uL (ref 4.0–10.5)

## 2018-03-25 LAB — TYPE AND SCREEN
ABO/RH(D): B POS
Antibody Screen: NEGATIVE

## 2018-03-25 MED ORDER — EPHEDRINE 5 MG/ML INJ
10.0000 mg | INTRAVENOUS | Status: DC | PRN
  Filled 2018-03-25: qty 2

## 2018-03-25 MED ORDER — ONDANSETRON HCL 4 MG/2ML IJ SOLN
4.0000 mg | Freq: Four times a day (QID) | INTRAMUSCULAR | Status: DC | PRN
  Administered 2018-03-25: 4 mg via INTRAVENOUS
  Filled 2018-03-25: qty 2

## 2018-03-25 MED ORDER — TERBUTALINE SULFATE 1 MG/ML IJ SOLN
0.2500 mg | Freq: Once | INTRAMUSCULAR | Status: DC | PRN
  Filled 2018-03-25: qty 1

## 2018-03-25 MED ORDER — FENTANYL 2.5 MCG/ML BUPIVACAINE 1/10 % EPIDURAL INFUSION (WH - ANES)
14.0000 mL/h | INTRAMUSCULAR | Status: DC | PRN
  Administered 2018-03-25: 14 mL/h via EPIDURAL
  Filled 2018-03-25: qty 100

## 2018-03-25 MED ORDER — SODIUM CHLORIDE 0.9 % IV SOLN: 250.0000 mL | INTRAVENOUS | Status: DC | PRN

## 2018-03-25 MED ORDER — PHENYLEPHRINE 40 MCG/ML (10ML) SYRINGE FOR IV PUSH (FOR BLOOD PRESSURE SUPPORT)
80.0000 ug | PREFILLED_SYRINGE | INTRAVENOUS | Status: DC | PRN
  Filled 2018-03-25: qty 5
  Filled 2018-03-25: qty 10

## 2018-03-25 MED ORDER — LACTATED RINGERS IV SOLN
500.0000 mL | INTRAVENOUS | Status: DC | PRN
  Administered 2018-03-25: 125 mL via INTRAVENOUS

## 2018-03-25 MED ORDER — ACETAMINOPHEN 325 MG PO TABS: 650.0000 mg | ORAL_TABLET | ORAL | Status: DC | PRN

## 2018-03-25 MED ORDER — OXYTOCIN 40 UNITS IN LACTATED RINGERS INFUSION - SIMPLE MED
1.0000 m[IU]/min | INTRAVENOUS | Status: DC
  Administered 2018-03-25: 2 m[IU]/min via INTRAVENOUS

## 2018-03-25 MED ORDER — DIPHENHYDRAMINE HCL 50 MG/ML IJ SOLN: 12.5000 mg | INTRAMUSCULAR | Status: DC | PRN

## 2018-03-25 MED ORDER — OXYTOCIN 40 UNITS IN LACTATED RINGERS INFUSION - SIMPLE MED
2.5000 [IU]/h | INTRAVENOUS | Status: DC
  Administered 2018-03-25 (×2): 2.5 [IU]/h via INTRAVENOUS
  Filled 2018-03-25: qty 1000

## 2018-03-25 MED ORDER — OXYTOCIN BOLUS FROM INFUSION
500.0000 mL | Freq: Once | INTRAVENOUS | Status: AC
  Administered 2018-03-25: 500 mL via INTRAVENOUS

## 2018-03-25 MED ORDER — LIDOCAINE HCL (PF) 1 % IJ SOLN
30.0000 mL | INTRAMUSCULAR | Status: DC | PRN
  Filled 2018-03-25: qty 30

## 2018-03-25 MED ORDER — LACTATED RINGERS IV SOLN
INTRAVENOUS | Status: DC
  Administered 2018-03-25 (×2): via INTRAVENOUS

## 2018-03-25 MED ORDER — FENTANYL CITRATE (PF) 100 MCG/2ML IJ SOLN: 50.0000 ug | INTRAMUSCULAR | Status: DC | PRN

## 2018-03-25 MED ORDER — OXYCODONE-ACETAMINOPHEN 5-325 MG PO TABS: 1.0000 | ORAL_TABLET | ORAL | Status: DC | PRN

## 2018-03-25 MED ORDER — PHENYLEPHRINE 40 MCG/ML (10ML) SYRINGE FOR IV PUSH (FOR BLOOD PRESSURE SUPPORT)
80.0000 ug | PREFILLED_SYRINGE | INTRAVENOUS | Status: DC | PRN
  Filled 2018-03-25: qty 5

## 2018-03-25 MED ORDER — SODIUM CHLORIDE 0.9% FLUSH: 3.0000 mL | Freq: Two times a day (BID) | INTRAVENOUS | Status: DC

## 2018-03-25 MED ORDER — SODIUM CHLORIDE 0.9% FLUSH: 3.0000 mL | INTRAVENOUS | Status: DC | PRN

## 2018-03-25 MED ORDER — SOD CITRATE-CITRIC ACID 500-334 MG/5ML PO SOLN: 30.0000 mL | ORAL | Status: DC | PRN

## 2018-03-25 MED ORDER — LACTATED RINGERS IV SOLN
500.0000 mL | Freq: Once | INTRAVENOUS | Status: AC
  Administered 2018-03-25: 500 mL via INTRAVENOUS

## 2018-03-25 MED ORDER — LIDOCAINE HCL (PF) 1 % IJ SOLN
INTRAMUSCULAR | Status: DC | PRN
Start: 2018-03-25 — End: 2018-03-25
  Administered 2018-03-25 (×2): 6 mL via EPIDURAL

## 2018-03-25 MED ORDER — OXYCODONE-ACETAMINOPHEN 5-325 MG PO TABS: 2.0000 | ORAL_TABLET | ORAL | Status: DC | PRN

## 2018-03-25 NOTE — MAU Note (Signed)
Pt presents to MAU with complaints of contractions that started early this morning. Denies any LOF or VB. +FM

## 2018-03-25 NOTE — Anesthesia Preprocedure Evaluation (Signed)
Anesthesia Evaluation  Patient identified by MRN, date of birth, ID band Patient awake    Reviewed: Allergy & Precautions, H&P , NPO status , Patient's Chart, lab work & pertinent test results  Airway Mallampati: II  TM Distance: >3 FB Neck ROM: full    Dental no notable dental hx. (+) Teeth Intact   Pulmonary    Pulmonary exam normal breath sounds clear to auscultation       Cardiovascular negative cardio ROS Normal cardiovascular exam Rhythm:regular Rate:Normal     Neuro/Psych negative neurological ROS  negative psych ROS   GI/Hepatic negative GI ROS, Neg liver ROS,   Endo/Other  negative endocrine ROS  Renal/GU negative Renal ROS  negative genitourinary   Musculoskeletal   Abdominal Normal abdominal exam  (+)   Peds  Hematology  (+) anemia ,   Anesthesia Other Findings   Reproductive/Obstetrics (+) Pregnancy                             Anesthesia Physical Anesthesia Plan  ASA: II  Anesthesia Plan: Epidural   Post-op Pain Management:    Induction:   PONV Risk Score and Plan:   Airway Management Planned:   Additional Equipment:   Intra-op Plan:   Post-operative Plan:   Informed Consent: I have reviewed the patients History and Physical, chart, labs and discussed the procedure including the risks, benefits and alternatives for the proposed anesthesia with the patient or authorized representative who has indicated his/her understanding and acceptance.     Plan Discussed with:   Anesthesia Plan Comments:         Anesthesia Quick Evaluation

## 2018-03-25 NOTE — H&P (Signed)
Kaitlyn Hardin is a 22 y.o. female presenting for early labor. OB History    Gravida  2   Para  1   Term  1   Preterm      AB      Living  1     SAB      TAB      Ectopic      Multiple  0   Live Births  1          Past Medical History:  Diagnosis Date  . Asthma    hx of   Past Surgical History:  Procedure Laterality Date  . NO PAST SURGERIES     Family History: family history includes Cancer in her maternal grandfather and paternal grandfather. Social History:  reports that she has never smoked. She has never used smokeless tobacco. She reports that she does not drink alcohol or use drugs.     Maternal Diabetes: No Genetic Screening: Normal Maternal Ultrasounds/Referrals: Normal Fetal Ultrasounds or other Referrals:  None Maternal Substance Abuse:  No Significant Maternal Medications:  None Significant Maternal Lab Results:  None Other Comments:  None  Review of Systems  Gastrointestinal: Positive for abdominal pain.  All other systems reviewed and are negative.  Maternal Medical History:  Reason for admission: Contractions.   Contractions: Onset was 6-12 hours ago.   Frequency: irregular.   Duration is approximately 60 seconds.   Perceived severity is moderate.    Fetal activity: Perceived fetal activity is normal.   Last perceived fetal movement was within the past hour.    Prenatal Complications - Diabetes: none.    Dilation: 3.5 Effacement (%): 90 Exam by:: ginger morris rn  Vitals:   03/25/18 1051  BP: 102/67  Pulse: 86  Resp: 16  Temp: 98.2 F (36.8 C)  TempSrc: Oral     Maternal Exam:  Uterine Assessment: Contraction strength is moderate.  Contraction duration is 60 seconds. Contraction frequency is irregular.   Abdomen: Patient reports no abdominal tenderness. Fundal height is Size=dates.   Estimated fetal weight is 7lbs.   Fetal presentation: vertex     Fetal Exam Fetal Monitor Review: Mode: ultrasound.    Baseline rate: 125.  Variability: moderate (6-25 bpm).   Pattern: accelerations present.    Fetal State Assessment: Category I - tracings are normal.     Physical Exam  Nursing note and vitals reviewed. Constitutional: She is oriented to person, place, and time. She appears well-developed and well-nourished.  HENT:  Head: Normocephalic.  Eyes: Pupils are equal, round, and reactive to light.  Cardiovascular: Normal rate, regular rhythm and normal heart sounds.  Respiratory: Effort normal and breath sounds normal.  Musculoskeletal: Normal range of motion.  Neurological: She is alert and oriented to person, place, and time.  Skin: Skin is warm and dry.  Psychiatric: She has a normal mood and affect. Her behavior is normal. Judgment and thought content normal.    Prenatal labs: ABO, Rh: --/--/B POS (12/19 1725) Antibody:  Negative Rubella:  Immune RPR:   NR HBsAg:   NR HIV: Non Reactive (12/19 1724)  GBS:   Negative  Assessment/Plan: 22 y.o. G2P1 at 8767w4d Early labor Category 1 FHTs Admit to L&D Patient desires epidural for pain management  Recheck after epidural placement or as needed, will consider AROM at that time    Janeece Riggersllis K Marquell Saenz 03/25/2018, 12:30 PM

## 2018-03-25 NOTE — MAU Note (Signed)
Urine sent to lab 

## 2018-03-25 NOTE — Progress Notes (Signed)
Kaitlyn Hardin is a 22 y.o. G2P1001 at 313w4d admitted for labor.   Subjective: Patient had begun to feel intermittent pressure. RN checked and stated patient was the same but that she could feel a forebag. Discussed AROM of forebag with patient and she agreed. After cervical manipulation for AROM, would say vaginal exam is more consistent with 7cm dilation. Patient's contractions have spaced out somewhat and prior to AROM patient's cervix was unchanged. Discussed possibly starting pitocin but patient declines at this time. Encouraged continued position changes to assist descent of fetal head, patient assisted into an open legged high fowler's position.   Objective: Vitals:   03/25/18 1701 03/25/18 1730 03/25/18 1738 03/25/18 1800  BP: 109/71 108/69  92/68  Pulse: 64 67  72  Resp: 17   17  Temp:   97.6 F (36.4 C)   TempSrc:   Oral   SpO2:      Weight:      Height:        FHT:  FHR: 130 bpm, variability: moderate,  accelerations:  Present,  decelerations:  Absent UC:   irregular, every 3-6 minutes SVE: Dilation: 7 Effacement (%): 100 Cervical Position: Middle Station: -1 Presentation: Vertex Exam by:: Tahlia Deamer cnm  Cervix is very soft and stretchy.   Labs: Lab Results  Component Value Date   WBC 9.0 03/25/2018   HGB 11.4 (L) 03/25/2018   HCT 33.4 (L) 03/25/2018   MCV 89.8 03/25/2018   PLT 147 (L) 03/25/2018    Assessment / Plan: Spontaneous labor, progressing normally  Labor: Progressing normally Preeclampsia:  no signs or symptoms of toxicity, intake and ouput balanced and labs stable Fetal Wellbeing:  Category I Pain Control:  Epidural I/D:  n/a Anticipated MOD:  NSVD  Kaitlyn Hardin 03/25/2018, 6:05 PM

## 2018-03-25 NOTE — Anesthesia Pain Management Evaluation Note (Signed)
  CRNA Pain Management Visit Note  Patient: Kaitlyn Hardin, 22 y.o., female  "Hello I am a member of the anesthesia team at [facility]. We have an anesthesia team available at all times to provide care throughout the hospital, including epidural management and anesthesia for C-section. I don't know your plan for the delivery whether it a natural birth, water birth, IV sedation, nitrous supplementation, doula or epidural, but we want to meet your pain goals."   1.Was your pain managed to your expectations on prior hospitalizations?   Yes   2.What is your expectation for pain management during this hospitalization?     Epidural  3.How can we help you reach that goal? epidural  Record the patient's initial score and the patient's pain goal.   Pain: 10/10  Pain Goal: 3/10 [facility] wants you to be able to say your pain was always managed very well.  Kaitlyn Hardin, Kaitlyn Hardin 03/25/2018

## 2018-03-25 NOTE — Progress Notes (Signed)
Subjective: Pt feeling pressure.  Rn checked pt with no change noted.  Objective: BP 116/75   Pulse 80   Temp 97.9 F (36.6 C) (Oral)   Resp 17   Ht 4\' 11"  (1.499 m)   Wt 69.4 kg (153 lb)   LMP 06/22/2017 (Approximate)   SpO2 100%   BMI 30.90 kg/m  I/O last 3 completed shifts: In: 600.3 [I.V.:600.3] Out: 350 [Urine:350] No intake/output data recorded.  FHT: Category 1 UC:   irregular, every 3-4 minutes SVE:   Dilation: 7 Effacement (%): 100 Station: -1 Exam by:: Minus Libertyhristy Leshowitz, RN Pitocin at start IUPC placed without difficulty.  Contraction inadequate.   Assessment:  Active labor with slow progress Cat 1 strip  Plan: Start pitocin Anticipate SVE  Kenney HousemanNancy Jean Chaynce Schafer CNM, MSN 03/25/2018, 9:04 PM

## 2018-03-25 NOTE — Progress Notes (Signed)
Kaitlyn Hardin is a 22 y.o. G2P1001 at 7566w4d admitted for labor.   Subjective: Patient is comfortable with epidural. RN noted SROM when placing urinary catheter. Patient assisted to a side lying position with peanut ball, discussed positions to help open pelvis and encourage descent.   Objective: Vitals:   03/25/18 1425 03/25/18 1431 03/25/18 1435 03/25/18 1500  BP: 115/75 119/76 115/79 121/84  Pulse: 78 75 69 71  Resp: 17  18 18   Temp:      TempSrc:      SpO2: 100%  100%   Weight:      Height:        FHT:  FHR: 130 bpm, variability: moderate,  accelerations:  Present,  decelerations:  Absent UC:   irregular, every 3-6 minutes SVE:  6/100%/-1  Labs: Lab Results  Component Value Date   WBC 9.0 03/25/2018   HGB 11.4 (L) 03/25/2018   HCT 33.4 (L) 03/25/2018   MCV 89.8 03/25/2018   PLT 147 (L) 03/25/2018    Assessment / Plan: Spontaneous labor, progressing normally  Labor: Progressing normally Preeclampsia:  no signs or symptoms of toxicity, intake and ouput balanced and labs stable Fetal Wellbeing:  Category I Pain Control:  Epidural I/D:  n/a Anticipated MOD:  NSVD  Kaitlyn Hardin 03/25/2018, 3:17 PM

## 2018-03-25 NOTE — Anesthesia Procedure Notes (Signed)
Epidural Patient location during procedure: OB Start time: 03/25/2018 2:03 PM End time: 03/25/2018 2:07 PM  Staffing Anesthesiologist: Leilani AbleHatchett, Tailor Westfall, MD Performed: anesthesiologist   Preanesthetic Checklist Completed: patient identified, site marked, surgical consent, pre-op evaluation, timeout performed, IV checked, risks and benefits discussed and monitors and equipment checked  Epidural Patient position: sitting Prep: site prepped and draped and DuraPrep Patient monitoring: continuous pulse ox and blood pressure Approach: midline Location: L3-L4 Injection technique: LOR air  Needle:  Needle type: Tuohy  Needle gauge: 17 G Needle length: 9 cm and 9 Needle insertion depth: 7 cm Catheter type: closed end flexible Catheter size: 19 Gauge Catheter at skin depth: 12 cm Test dose: negative and Other  Assessment Sensory level: T9 Events: blood not aspirated, injection not painful, no injection resistance, negative IV test and no paresthesia  Additional Notes Reason for block:procedure for pain

## 2018-03-26 LAB — CBC
HEMATOCRIT: 28.7 % — AB (ref 36.0–46.0)
HEMOGLOBIN: 10 g/dL — AB (ref 12.0–15.0)
MCH: 30.8 pg (ref 26.0–34.0)
MCHC: 34.8 g/dL (ref 30.0–36.0)
MCV: 88.3 fL (ref 78.0–100.0)
Platelets: 125 10*3/uL — ABNORMAL LOW (ref 150–400)
RBC: 3.25 MIL/uL — ABNORMAL LOW (ref 3.87–5.11)
RDW: 13.2 % (ref 11.5–15.5)
WBC: 16.3 10*3/uL — ABNORMAL HIGH (ref 4.0–10.5)

## 2018-03-26 LAB — RPR: RPR Ser Ql: NONREACTIVE

## 2018-03-26 MED ORDER — PNEUMOCOCCAL VAC POLYVALENT 25 MCG/0.5ML IJ INJ
0.5000 mL | INJECTION | INTRAMUSCULAR | Status: DC
  Filled 2018-03-26: qty 0.5

## 2018-03-26 MED ORDER — SENNOSIDES-DOCUSATE SODIUM 8.6-50 MG PO TABS
2.0000 | ORAL_TABLET | ORAL | Status: DC
  Administered 2018-03-26: 2 via ORAL
  Filled 2018-03-26: qty 2

## 2018-03-26 MED ORDER — IBUPROFEN 600 MG PO TABS
600.0000 mg | ORAL_TABLET | Freq: Four times a day (QID) | ORAL | Status: DC
  Administered 2018-03-26 – 2018-03-27 (×6): 600 mg via ORAL
  Filled 2018-03-26 (×6): qty 1

## 2018-03-26 MED ORDER — DIPHENHYDRAMINE HCL 25 MG PO CAPS
25.0000 mg | ORAL_CAPSULE | Freq: Four times a day (QID) | ORAL | Status: DC | PRN
Start: 2018-03-26 — End: 2018-03-27

## 2018-03-26 MED ORDER — COCONUT OIL OIL
1.0000 "application " | TOPICAL_OIL | Status: DC | PRN
  Administered 2018-03-26: 1 via TOPICAL
  Filled 2018-03-26: qty 120

## 2018-03-26 MED ORDER — ALBUTEROL SULFATE (2.5 MG/3ML) 0.083% IN NEBU: 3.0000 mL | INHALATION_SOLUTION | Freq: Four times a day (QID) | RESPIRATORY_TRACT | Status: DC | PRN

## 2018-03-26 MED ORDER — BENZOCAINE-MENTHOL 20-0.5 % EX AERO
1.0000 | INHALATION_SPRAY | CUTANEOUS | Status: DC | PRN
Start: 2018-03-26 — End: 2018-03-27
  Administered 2018-03-26 – 2018-03-27 (×2): 1 via TOPICAL
  Filled 2018-03-26 (×2): qty 56

## 2018-03-26 MED ORDER — ZOLPIDEM TARTRATE 5 MG PO TABS: 5.0000 mg | ORAL_TABLET | Freq: Every evening | ORAL | Status: DC | PRN

## 2018-03-26 MED ORDER — DIBUCAINE 1 % RE OINT: 1.0000 "application " | TOPICAL_OINTMENT | RECTAL | Status: DC | PRN

## 2018-03-26 MED ORDER — ACETAMINOPHEN 325 MG PO TABS: 650.0000 mg | ORAL_TABLET | ORAL | Status: DC | PRN

## 2018-03-26 MED ORDER — PRENATAL MULTIVITAMIN CH
1.0000 | ORAL_TABLET | Freq: Every day | ORAL | Status: DC
  Administered 2018-03-26 – 2018-03-27 (×2): 1 via ORAL
  Filled 2018-03-26 (×2): qty 1

## 2018-03-26 MED ORDER — ONDANSETRON HCL 4 MG/2ML IJ SOLN: 4.0000 mg | INTRAMUSCULAR | Status: DC | PRN

## 2018-03-26 MED ORDER — WITCH HAZEL-GLYCERIN EX PADS
1.0000 | MEDICATED_PAD | CUTANEOUS | Status: DC | PRN
Start: 2018-03-26 — End: 2018-03-27

## 2018-03-26 MED ORDER — TETANUS-DIPHTH-ACELL PERTUSSIS 5-2.5-18.5 LF-MCG/0.5 IM SUSP: 0.5000 mL | Freq: Once | INTRAMUSCULAR | Status: DC

## 2018-03-26 MED ORDER — ONDANSETRON HCL 4 MG PO TABS: 4.0000 mg | ORAL_TABLET | ORAL | Status: DC | PRN

## 2018-03-26 MED ORDER — SIMETHICONE 80 MG PO CHEW: 80.0000 mg | CHEWABLE_TABLET | ORAL | Status: DC | PRN

## 2018-03-26 NOTE — Anesthesia Postprocedure Evaluation (Signed)
Anesthesia Post Note  Patient: Kaitlyn Hardin  Procedure(s) Performed: AN AD HOC LABOR EPIDURAL     Patient location during evaluation: Mother Baby Anesthesia Type: Epidural Level of consciousness: awake and alert Pain management: pain level controlled Vital Signs Assessment: post-procedure vital signs reviewed and stable Respiratory status: spontaneous breathing, nonlabored ventilation and respiratory function stable Cardiovascular status: stable Postop Assessment: no headache, no backache, epidural receding, no apparent nausea or vomiting, able to ambulate, patient able to bend at knees and adequate PO intake Anesthetic complications: no    Last Vitals:  Vitals:   03/26/18 0100 03/26/18 0431  BP: 90/77 118/74  Pulse: 60 (!) 59  Resp:    Temp: 37.2 C 37.1 C  SpO2:      Last Pain:  Vitals:   03/26/18 0506  TempSrc:   PainSc: 0-No pain   Pain Goal:                 Land O'LakesMalinova,Tomma Ehinger Hristova

## 2018-03-26 NOTE — Progress Notes (Signed)
Post Partum Day 1  Subjective: no complaints, up ad lib, voiding and tolerating PO  Objective: Vitals:   03/26/18 0000 03/26/18 0100 03/26/18 0431 03/26/18 0830  BP: 110/78 90/77 118/74 115/63  Pulse: 79 60 (!) 59 (!) 59  Resp: 18   16  Temp: 98.7 F (37.1 C) 99 F (37.2 C) 98.7 F (37.1 C) 98.1 F (36.7 C)  TempSrc: Oral Oral Oral Oral  SpO2:      Weight:      Height:        Physical Exam:  General: alert and cooperative Lochia: appropriate Uterine Fundus: firm Incision: n/a DVT Evaluation: No evidence of DVT seen on physical exam. Negative Homan's sign. No cords or calf tenderness. No significant calf/ankle edema.  Recent Labs    03/25/18 1222 03/26/18 0633  HGB 11.4* 10.0*  HCT 33.4* 28.7*    Assessment/Plan: Plan for discharge tomorrow  Bottle feeding   LOS: 1 day   Janeece Riggersllis K Emilea Goga 03/26/2018, 11:16 AM

## 2018-03-26 NOTE — Lactation Note (Signed)
This note was copied from a baby's chart. Lactation Consultation Note  Patient Name: Kaitlyn Hardin ZOXWR'UToday's Date: 03/26/2018 Reason for consult: Follow-up assessment;1st time breastfeeding;Term  P1, 25 hour female infant. Mom did not attend BF classes in pregnancy. Has BF from infant Paternal GM her mother in law has BF experience. [facility].  LC taught hand expression, breast compression and breast massage. Mom taught back hand expression and colostrum was expressed. Parents will both start doing STS, infant wrapped in multiple blankets and full cloth when LC enter room, and  mom was just beginning to latch infant to breast.  LC notice slight abrasion,  only on left nipple infant , infant atching on tip of nipple. Mom has abrasion on nipple tip Nurse will give coconut oil and Mom will express breast milk on breast after feeding to help with soreness/ healing. LC assisted mom, infant latched on left breast in cross-cradle hold,  chin first , nose touching breast with wide gape, audible swallowing heard during feeding 15 minutes  and mom pleased w/ latch. Mom was still feeding when LC left room.  Mom encouraged to feed baby 8-12 times/24 hours and with feeding cues.  LC discussed I&O. Reviewed Baby & Me book's Breastfeeding Basics.   Mom made aware of O/P services, breastfeeding support groups, community resources, and our phone # for post-discharge questions.  Maternal Data Formula Feeding for Exclusion: No Has patient been taught Hand Expression?: Yes Does the patient have breastfeeding experience prior to this delivery?: No  Feeding Feeding Type: Breast Fed Length of feed: 15 min(Still feeding when LC left room.)  LATCH Score Latch: Grasps breast easily, tongue down, lips flanged, rhythmical sucking.  Audible Swallowing: Spontaneous and intermittent  Type of Nipple: Everted at rest and after stimulation  Comfort (Breast/Nipple):  Filling, red/small blisters or bruises, mild/mod discomfort(Mom will apply EBM and coconut oil/ left breast)  Hold (Positioning): Assistance needed to correctly position infant at breast and maintain latch.  LATCH Score: 8  Interventions Interventions: Breast feeding basics reviewed;Assisted with latch;Skin to skin;Breast massage;Hand express;Support pillows;Position options;Adjust position;Breast compression;Coconut oil  Lactation Tools Discussed/Used WIC Program: No(Parents not interested in applying.)   Consult Status Consult Status: Follow-up Date: 03/27/18 Follow-up type: In-patient    Kaitlyn Hardin 03/26/2018, 11:30 PM

## 2018-03-26 NOTE — Lactation Note (Signed)
This note was copied from a baby's chart. Lactation Consultation Note  Patient Name: Kaitlyn Hardin ZOXWR'UToday's Date: 03/26/2018   [facility]  Attempted to visit with family but they have a "Do Not Disturb" sign on door; will return later today.      [name] R Dahlia Nifong 03/26/2018, 10:57 AM

## 2018-03-27 MED ORDER — IBUPROFEN 600 MG PO TABS: 600.0000 mg | ORAL_TABLET | Freq: Four times a day (QID) | ORAL | 0 refills | Status: DC | PRN

## 2018-03-27 NOTE — Discharge Summary (Signed)
OB Discharge Summary     Patient Name: Kaitlyn Hardin DOB: 06-06-1996 MRN: [id number]  Date of admission: 03/25/2018 Delivering MD: Kenney HousemanPROTHERO, NANCY JEAN   Date of discharge: 03/27/2018  Admitting diagnosis: 37WKS,CTX Intrauterine pregnancy: 3565w4d     Secondary diagnosis:  Active Problems:   Normal labor   SVD (spontaneous vaginal delivery)  Discharge diagnosis: Term Pregnancy Delivered                                                                                                Post partum procedures:n/a  Augmentation: AROM and Pitocin  Complications: None  Hospital course:  Onset of Labor With Vaginal Delivery     22 y.o. yo [id number] at 5065w4d was admitted in Latent Labor on 03/25/2018. Patient had an uncomplicated labor course as follows:  Membrane Rupture Time/Date: 3:10 PM ,03/25/2018   Intrapartum Procedures: Episiotomy: None [1]                                         Lacerations:  Labial [10]  Patient had a delivery of a Viable infant. 03/25/2018  Information for the patient's newborn:  Michiel Sitesadilla, Boy Fairy [[id number]][[id number]]  Delivery Method: Vaginal, Spontaneous(Filed from Delivery Summary)    Pateint had an uncomplicated postpartum course.  She is ambulating, tolerating a regular diet, passing flatus, and urinating well. Patient is discharged home in stable condition on 03/27/18.   Physical exam  Vitals:   03/26/18 1140 03/26/18 1445 03/26/18 2230 03/27/18 0500  BP: 102/74 110/76 104/68 (!) 109/57  Pulse: (!) 58 71 80 72  Resp: 18 20 18    Temp: 98.1 F (36.7 C) 98.6 F (37 C) 98 F (36.7 C) 98 F (36.7 C)  TempSrc: Oral Oral Oral Oral  SpO2:  100% 99%   Weight:      Height:       General: alert, cooperative and no distress Lochia: appropriate Uterine Fundus: firm Incision: N/A DVT Evaluation: No evidence of DVT seen on physical exam. Negative Homan's sign. No cords or calf tenderness. No significant calf/ankle edema. Labs: Lab Results  Component Value Date    WBC 16.3 (H) 03/26/2018   HGB 10.0 (L) 03/26/2018   HCT 28.7 (L) 03/26/2018   MCV 88.3 03/26/2018   PLT 125 (L) 03/26/2018   CMP Latest Ref Rng & Units 09/15/2017  Glucose 65 - 99 mg/dL 86  BUN 6 - 20 mg/dL 7  Creatinine 1.300.44 - 8.651.00 mg/dL 7.84(O0.39(L)  Sodium 962135 - 952145 mmol/L 135  Potassium 3.5 - 5.1 mmol/L 4.0  Chloride 101 - 111 mmol/L 106  CO2 22 - 32 mmol/L 20(L)  Calcium 8.9 - 10.3 mg/dL 8.9  Total Protein 6.5 - 8.1 g/dL 6.7  Total Bilirubin 0.3 - 1.2 mg/dL 0.6  Alkaline Phos 38 - 126 U/L 51  AST 15 - 41 U/L 19  ALT 14 - 54 U/L 9(L)    Discharge instruction: per After Visit Summary and "Baby and Me Booklet".  After visit meds:  Allergies as  of 03/27/2018   No Known Allergies     Medication List    TAKE these medications   albuterol 108 (90 Base) MCG/ACT inhaler Commonly known as:  PROVENTIL HFA;VENTOLIN HFA Inhale 1-2 puffs into the lungs every 6 (six) hours as needed for wheezing or shortness of breath.   ibuprofen 600 MG tablet Commonly known as:  ADVIL,MOTRIN Take 1 tablet (600 mg total) by mouth every 6 (six) hours as needed for mild pain or moderate pain.       Diet: routine diet  Activity: Advance as tolerated. Pelvic rest for 6 weeks.   Outpatient follow up:6 weeks Follow up Appt:No future appointments. Follow up Visit:No follow-ups on file.  Postpartum contraception: Nuvaring  Newborn Data: Live born female  Birth Weight: 7 lb 13.6 oz (3561 g) APGAR: 8, 9  Newborn Delivery   Birth date/time:  03/25/2018 21:52:00 Delivery type:  Vaginal, Spontaneous     Baby Feeding: Bottle Disposition:home with mother   03/27/2018 Janeece Riggers, CNM

## 2018-03-27 NOTE — Lactation Note (Signed)
This note was copied from a baby's chart. Lactation Consultation Note  Patient Name: Kaitlyn Letta MedianDulce Gonzalo ZHYQM'VToday's Date: 03/27/2018 Reason for consult: Follow-up assessment;Difficult latch;Term;Primapara Baby is 2537 hours old and at a 6% weight loss.  Baby is having some difficulty latching so parents have been formula feeding.  Mom states she received BF assist last evening.  Mom doesn't feel she can always obtain a correct latch.  Baby was just fed formula and he is sleeping in crib.  Instructed mom to call for assist when he wakes up and starts showing cues.  Manual pump given with instructions to use every 3 hours and give any expressed milk back to baby.  Maternal Data    Feeding Feeding Type: Bottle Fed - Formula  LATCH Score                   Interventions    Lactation Tools Discussed/Used     Consult Status Consult Status: Complete Follow-up type: Call as needed    [name], [name] 03/27/2018, 11:06 AM

## 2018-03-27 NOTE — Lactation Note (Signed)
This note was copied from a [name] chart. Lactation Consultation Note  Patient Name: Kaitlyn Hardin ZOXWR'UToday'Hardin Date: 03/27/2018 Reason for consult: Follow-up assessment;Nipple pain/trauma Nipples are both cracked.  Assisted mom with positioning baby in football hold on right side.  Instructed mom on breast compression and support for a wide latch.  Baby opens wide and latched easily and with good depth.  Gentle chin tug done to bring lower lip out more.  Mom comfortable with feeding.  Comfort gels given with instructions.  Maternal Data    Feeding Feeding Type: Breast Fed  LATCH Score Latch: Grasps breast easily, tongue down, lips flanged, rhythmical sucking.  Audible Swallowing: A few with stimulation  Type of Nipple: Everted at rest and after stimulation  Comfort (Breast/Nipple): Filling, red/small blisters or bruises, mild/mod discomfort  Hold (Positioning): Assistance needed to correctly position infant at breast and maintain latch.  LATCH Score: 7  Interventions Interventions: Assisted with latch;Breast compression;Skin to skin;Adjust position;Breast massage;Support pillows;Hand express;Position options;Hand pump  Lactation Tools Discussed/Used     Consult Status Consult Status: Complete Follow-up type: Call as needed    Kaitlyn Hardin, Kaitlyn Hardin 03/27/2018, 12:14 PM

## 2018-03-27 NOTE — Discharge Instructions (Signed)
Postpartum Care After Vaginal Delivery ° °The period of time right after you deliver your newborn is called the postpartum period. °What kind of medical care will I receive? °· You may continue to receive fluids and medicines through an IV tube inserted into one of your veins. °· If an incision was made near your vagina (episiotomy) or if you had some vaginal tearing during delivery, cold compresses may be placed on your episiotomy or your tear. This helps to reduce pain and swelling. °· You may be given a squirt bottle to use when you go to the bathroom. You may use this until you are comfortable wiping as usual. To use the squirt bottle, follow these steps: °? Before you urinate, fill the squirt bottle with warm water. Do not use hot water. °? After you urinate, while you are sitting on the toilet, use the squirt bottle to rinse the area around your urethra and vaginal opening. This rinses away any urine and blood. °? You may do this instead of wiping. As you start healing, you may use the squirt bottle before wiping yourself. Make sure to wipe gently. °? Fill the squirt bottle with clean water every time you use the bathroom. °· You will be given sanitary pads to wear. °How can I expect to feel? °· You may not feel the need to urinate for several hours after delivery. °· You will have some soreness and pain in your abdomen and vagina. °· If you are breastfeeding, you may have uterine contractions every time you breastfeed for up to several weeks postpartum. Uterine contractions help your uterus return to its normal size. °· It is normal to have vaginal bleeding (lochia) after delivery. The amount and appearance of lochia is often similar to a menstrual period in the first week after delivery. It will gradually decrease over the next few weeks to a dry, yellow-brown discharge. For most women, lochia stops completely by 6-8 weeks after delivery. Vaginal bleeding can vary from woman to woman. °· Within the first few  days after delivery, you may have breast engorgement. This is when your breasts feel heavy, full, and uncomfortable. Your breasts may also throb and feel hard, tightly stretched, warm, and tender. After this occurs, you may have milk leaking from your breasts. Your health care provider can help you relieve discomfort due to breast engorgement. Breast engorgement should go away within a few days. °· You may feel more sad or worried than normal due to hormonal changes after delivery. These feelings should not last more than a few days. If these feelings do not go away after several days, speak with your health care provider. °How should I care for myself? °· Tell your health care provider if you have pain or discomfort. °· Drink enough water to keep your urine clear or pale yellow. °· Wash your hands thoroughly with soap and water for at least 20 seconds after changing your sanitary pads, after using the toilet, and before holding or feeding your baby. °· If you are not breastfeeding, avoid touching your breasts a lot. Doing this can make your breasts produce more milk. °· If you become weak or lightheaded, or you feel like you might faint, ask for help before: °? Getting out of bed. °? Showering. °· Change your sanitary pads frequently. Watch for any changes in your flow, such as a sudden increase in volume, a change in color, the passing of large blood clots. If you pass a blood clot from your vagina,   save it to show to your health care provider. Do not flush blood clots down the toilet without having your health care provider look at them.  Make sure that all your vaccinations are up to date. This can help protect you and your baby from getting certain diseases. You may need to have immunizations done before you leave the hospital.  If desired, talk with your health care provider about methods of family planning or birth control (contraception). How can I start bonding with my baby? Spending as much time as  possible with your baby is very important. During this time, you and your baby can get to know each other and develop a bond. Having your baby stay with you in your room (rooming in) can give you time to get to know your baby. Rooming in can also help you become comfortable caring for your baby. Breastfeeding can also help you bond with your baby. How can I plan for returning home with my baby?  Make sure that you have a car seat installed in your vehicle. ? Your car seat should be checked by a certified car seat installer to make sure that it is installed safely. ? Make sure that your baby fits into the car seat safely.  Ask your health care provider any questions you have about caring for yourself or your baby. Make sure that you are able to contact your health care provider with any questions after leaving the hospital. This information is not intended to replace advice given to you by your health care provider. Make sure you discuss any questions you have with your health care provider. Document Released: 06/17/2007 Document Revised: 01/23/2016 Document Reviewed: 07/25/2015 Elsevier Interactive Patient Education  2018 [employer].   Postpartum Depression and Baby Blues The postpartum period begins right after the birth of a baby. During this time, there is often a great amount of joy and excitement. It is also a time of many changes in the life of the parents. Regardless of how many times a mother gives birth, each child brings new challenges and dynamics to the family. It is not unusual to have feelings of excitement along with confusing shifts in moods, emotions, and thoughts. All mothers are at risk of developing postpartum depression or the "baby blues." These mood changes can occur right after giving birth, or they may occur many months after giving birth. The baby blues or postpartum depression can be mild or severe. Additionally, postpartum depression can go away rather quickly, or it can  be a long-term condition. What are the causes? Raised hormone levels and the rapid drop in those levels are thought to be a main cause of postpartum depression and the baby blues. A number of hormones change during and after pregnancy. Estrogen and progesterone usually decrease right after the delivery of your baby. The levels of thyroid hormone and various cortisol steroids also rapidly drop. Other factors that play a role in these mood changes include major life events and genetics. What increases the risk? If you have any of the following risks for the baby blues or postpartum depression, know what symptoms to watch out for during the postpartum period. Risk factors that may increase the likelihood of getting the baby blues or postpartum depression include:  Having a personal or family history of depression.  Having depression while being pregnant.  Having premenstrual mood issues or mood issues related to oral contraceptives.  Having a lot of life stress.  Having marital conflict.  Lacking  a social support network.  Having a baby with special needs.  Having health problems, such as diabetes.  What are the signs or symptoms? Symptoms of baby blues include:  Brief changes in mood, such as going from extreme happiness to sadness.  Decreased concentration.  Difficulty sleeping.  Crying spells, tearfulness.  Irritability.  Anxiety.  Symptoms of postpartum depression typically begin within the first month after giving birth. These symptoms include:  Difficulty sleeping or excessive sleepiness.  Marked weight loss.  Agitation.  Feelings of worthlessness.  Lack of interest in activity or food.  Postpartum psychosis is a very serious condition and can be dangerous. Fortunately, it is rare. Displaying any of the following symptoms is cause for immediate medical attention. Symptoms of postpartum psychosis include:  Hallucinations and delusions.  Bizarre or disorganized  behavior.  Confusion or disorientation.  How is this diagnosed? A diagnosis is made by an evaluation of your symptoms. There are no medical or lab tests that lead to a diagnosis, but there are various questionnaires that a health care provider may use to identify those with the baby blues, postpartum depression, or psychosis. Often, a screening tool called the [place] Postnatal Depression Scale is used to diagnose depression in the postpartum period. How is this treated? The baby blues usually goes away on its own in 1-2 weeks. Social support is often all that is needed. You will be encouraged to get adequate sleep and rest. Occasionally, you may be given medicines to help you sleep. Postpartum depression requires treatment because it can last several months or longer if it is not treated. Treatment may include individual or group therapy, medicine, or both to address any social, physiological, and psychological factors that may play a role in the depression. Regular exercise, a healthy diet, rest, and social support may also be strongly recommended. Postpartum psychosis is more serious and needs treatment right away. Hospitalization is often needed. Follow these instructions at home:  Get as much rest as you can. Nap when the baby sleeps.  Exercise regularly. Some women find yoga and walking to be beneficial.  Eat a balanced and nourishing diet.  Do little things that you enjoy. Have a cup of tea, take a bubble bath, read your favorite magazine, or listen to your favorite music.  Avoid alcohol.  Ask for help with household chores, cooking, grocery shopping, or running errands as needed. Do not try to do everything.  Talk to people close to you about how you are feeling. Get support from your partner, family members, friends, or other new moms.  Try to stay positive in how you think. Think about the things you are grateful for.  Do not spend a lot of time alone.  Only take  over-the-counter or prescription medicine as directed by your health care provider.  Keep all your postpartum appointments.  Let your health care provider know if you have any concerns. Contact a health care provider if: You are having a reaction to or problems with your medicine. Get help right away if:  You have suicidal feelings.  You think you may harm the baby or someone else. This information is not intended to replace advice given to you by your health care provider. Make sure you discuss any questions you have with your health care provider. Document Released: 05/24/2004 Document Revised: 01/26/2016 Document Reviewed: 06/01/2013 Elsevier Interactive Patient Education  2017 [employer]  Ethinyl Estradiol; Etonogestrel vaginal ring What is this medicine? ETHINYL ESTRADIOL; ETONOGESTREL (ETH in il es  tra DYE ole; et oh noe JES trel) vaginal ring is a flexible, vaginal ring used as a contraceptive (birth control method). This medicine combines two types of female hormones, an estrogen and a progestin. This ring is used to prevent ovulation and pregnancy. Each ring is effective for one month. This medicine may be used for other purposes; ask your health care provider or pharmacist if you have questions. COMMON BRAND NAME(S): NuvaRing What should I tell my health care provider before I take this medicine? They need to know if you have or ever had any of these conditions: -abnormal vaginal bleeding -blood vessel disease or blood clots -breast, cervical, endometrial, ovarian, liver, or uterine cancer -diabetes -gallbladder disease -heart disease or recent heart attack -high blood pressure -high cholesterol -kidney disease -liver disease -migraine headaches -stroke -systemic lupus erythematosus (SLE) -tobacco smoker -an unusual or allergic reaction to estrogens, progestins, other medicines, foods, dyes, or preservatives -pregnant or trying to get pregnant -breast-feeding How  should I use this medicine? Insert the ring into your vagina as directed. Follow the directions on the prescription label. The ring will remain place for 3 weeks and is then removed for a 1-week break. A new ring is inserted 1 week after the last ring was removed, on the same day of the week. Check often to make sure the ring is still in place, especially before and after sexual intercourse. If the ring was out of the vagina for an unknown amount of time, you may not be protected from pregnancy. Perform a pregnancy test and call your doctor. Do not use more often than directed. A patient package insert for the product will be given with each prescription and refill. Read this sheet carefully each time. The sheet may change frequently. Contact your pediatrician regarding the use of this medicine in children. Special care may be needed. This medicine has been used in female children who have started having menstrual periods. Overdosage: If you think you have taken too much of this medicine contact a poison control center or emergency room at once. NOTE: This medicine is only for you. Do not share this medicine with others. What if I miss a dose? You will need to replace your vaginal ring once a month as directed. If the ring should slip out, or if you leave it in longer or shorter than you should, contact your health care professional for advice. What may interact with this medicine? Do not take this medicine with the following medication: -dasabuvir; ombitasvir; paritaprevir; ritonavir -ombitasvir; paritaprevir; ritonavir This medicine may also interact with the following medications: -acetaminophen -antibiotics or medicines for infections, especially rifampin, rifabutin, rifapentine, and griseofulvin, and possibly penicillins or tetracyclines -aprepitant -ascorbic acid (vitamin C) -atorvastatin -barbiturate medicines, such as  phenobarbital -bosentan -carbamazepine -caffeine -clofibrate -cyclosporine -dantrolene -doxercalciferol -felbamate -grapefruit juice -hydrocortisone -medicines for anxiety or sleeping problems, such as diazepam or temazepam -medicines for diabetes, including pioglitazone -modafinil -mycophenolate -nefazodone -oxcarbazepine -phenytoin -prednisolone -ritonavir or other medicines for HIV infection or AIDS -rosuvastatin -selegiline -soy isoflavones supplements -St. John's wort -tamoxifen or raloxifene -theophylline -thyroid hormones -topiramate -warfarin This list may not describe all possible interactions. Give your health care provider a list of all the medicines, herbs, non-prescription drugs, or dietary supplements you use. Also tell them if you smoke, drink alcohol, or use illegal drugs. Some items may interact with your medicine. What should I watch for while using this medicine? Visit your doctor or health care professional for regular checks on your progress. You  will need a regular breast and pelvic exam and Pap smear while on this medicine. Use an additional method of contraception during the first cycle that you use this ring. Do not use a diaphragm or female condom, as the ring can interfere with these birth control methods and their proper placement. If you have any reason to think you are pregnant, stop using this medicine right away and contact your doctor or health care professional. If you are using this medicine for hormone related problems, it may take several cycles of use to see improvement in your condition. Smoking increases the risk of getting a blood clot or having a stroke while you are using hormonal birth control, especially if you are more than 24 years old. You are strongly advised not to smoke. This medicine can make your body retain fluid, making your fingers, hands, or ankles swell. Your blood pressure can go up. Contact your doctor or health care  professional if you feel you are retaining fluid. This medicine can make you more sensitive to the sun. Keep out of the sun. If you cannot avoid being in the sun, wear protective clothing and use sunscreen. Do not use sun lamps or tanning beds/booths. If you wear contact lenses and notice visual changes, or if the lenses begin to feel uncomfortable, consult your eye care specialist. In some women, tenderness, swelling, or minor bleeding of the gums may occur. Notify your dentist if this happens. Brushing and flossing your teeth regularly may help limit this. See your dentist regularly and inform your dentist of the medicines you are taking. If you are going to have elective surgery, you may need to stop using this medicine before the surgery. Consult your health care professional for advice. This medicine does not protect you against HIV infection (AIDS) or any other sexually transmitted diseases. What side effects may I notice from receiving this medicine? Side effects that you should report to your doctor or health care professional as soon as possible: -breast tissue changes or discharge -changes in vaginal bleeding during your period or between your periods -chest pain -coughing up blood -dizziness or fainting spells -headaches or migraines -leg, arm or groin pain -severe or sudden headaches -stomach pain (severe) -sudden shortness of breath -sudden loss of coordination, especially on one side of the body -speech problems -symptoms of vaginal infection like itching, irritation or unusual discharge -tenderness in the upper abdomen -vomiting -weakness or numbness in the arms or legs, especially on one side of the body -yellowing of the eyes or skin Side effects that usually do not require medical attention (report to your doctor or health care professional if they continue or are bothersome): -breakthrough bleeding and spotting that continues beyond the 3 initial cycles of pills -breast  tenderness -mood changes, anxiety, depression, frustration, anger, or emotional outbursts -increased sensitivity to sun or ultraviolet light -nausea -skin rash, acne, or brown spots on the skin -weight gain (slight) This list may not describe all possible side effects. Call your doctor for medical advice about side effects. You may report side effects to FDA at 1-800-FDA-1088. Where should I keep my medicine? Keep out of the reach of children. Store at room temperature between 15 and 30 degrees C (59 and 86 degrees F) for up to 4 months. The product will expire after 4 months. Protect from light. Throw away any unused medicine after the expiration date. NOTE: This sheet is a summary. It may not cover all possible information. If you have questions  about this medicine, talk to your doctor, pharmacist, or health care provider.  2018 Elsevier/Gold Standard (2016-04-27 17:00:31)

## 2018-07-29 IMAGING — US US OB COMP LESS 14 WK
1 series · 15 of 28 positions shown · non-contrast
Comparison: None.

CLINICAL DATA: Abdominal pain

EXAM:
OBSTETRIC <14 WK ULTRASOUND
TECHNIQUE: Transabdominal ultrasound was performed for evaluation of the
gestation as well as the maternal uterus and adnexal regions.

[Series 1: us ob comp less 14 wk · 41 acquisitions, 15 frames shown]
[im 1/41]
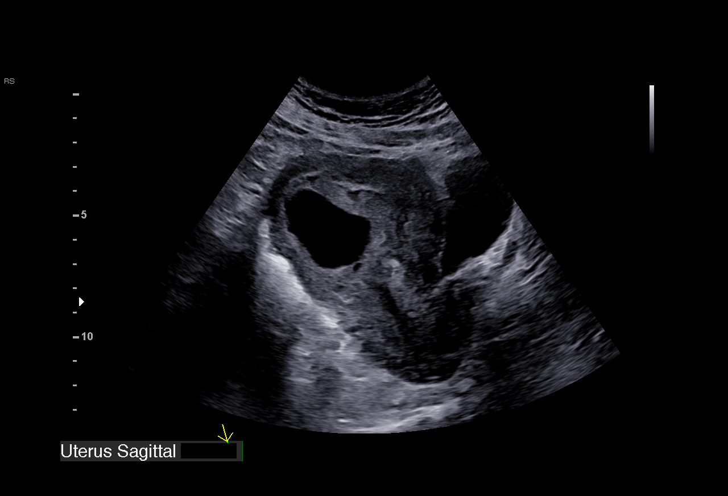
[im 3/41]
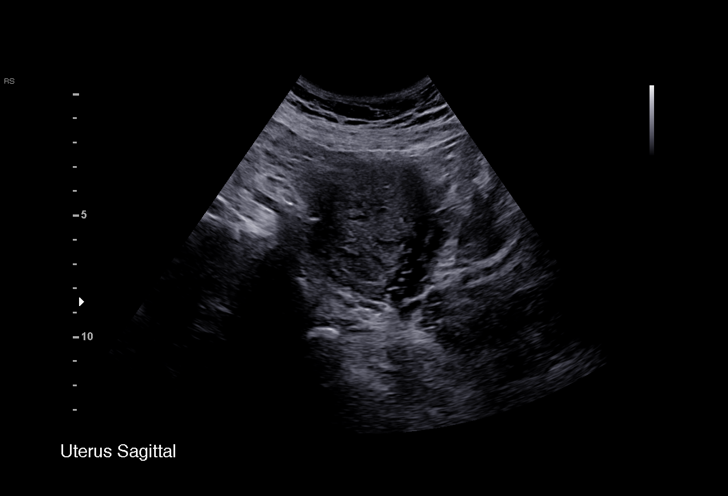
[im 6/41]
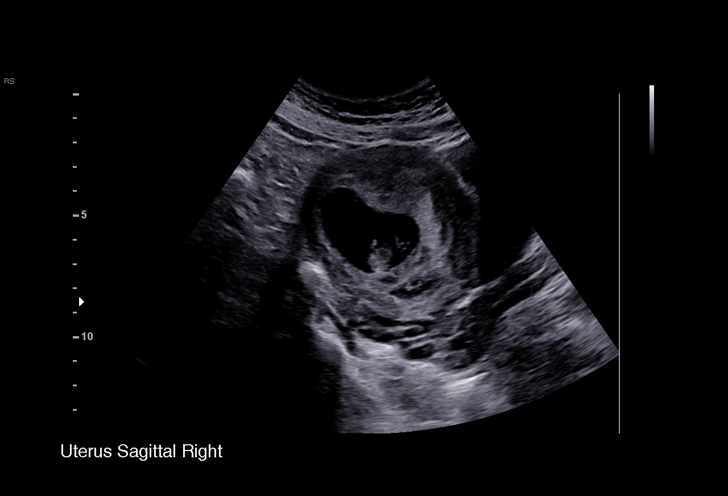
[im 9/41]
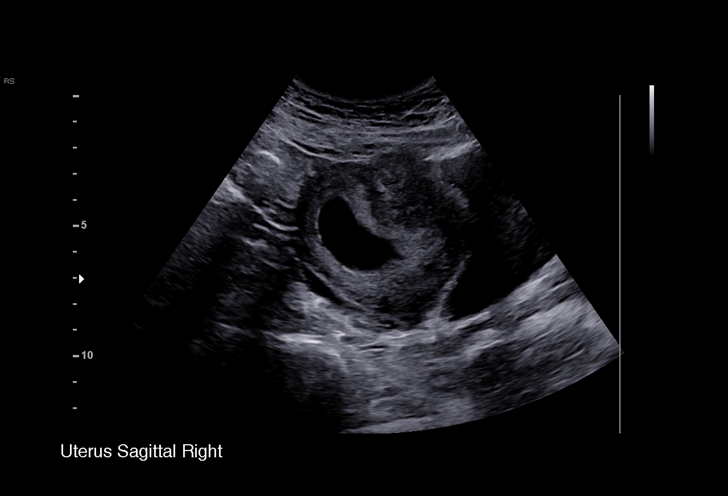
[im 12/41]
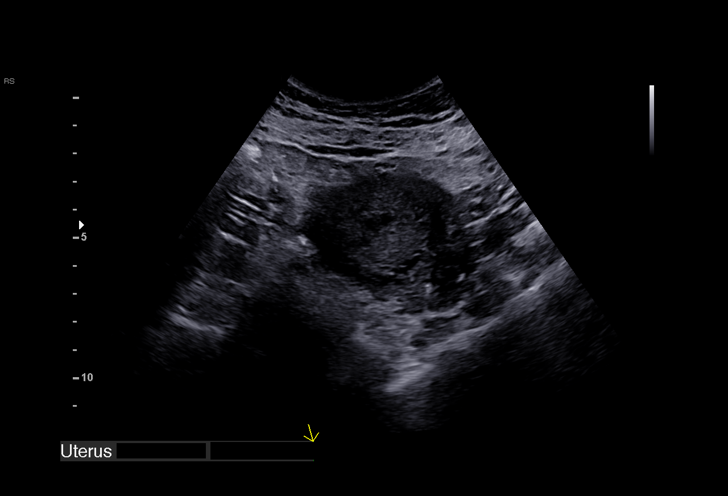
[im 15/41]
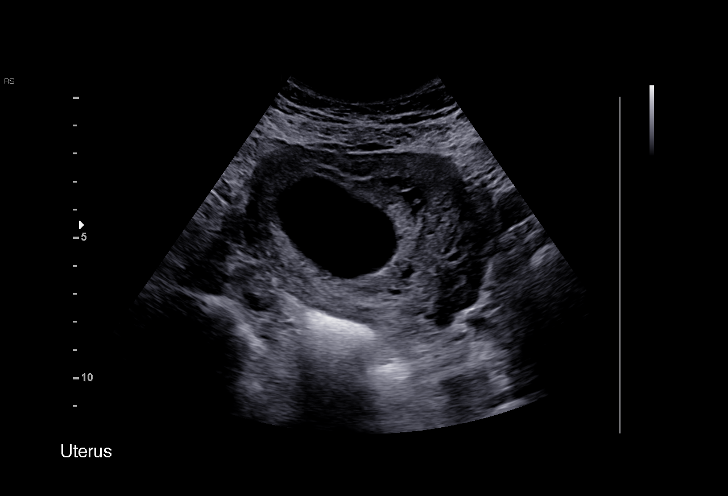
[im 18/41]
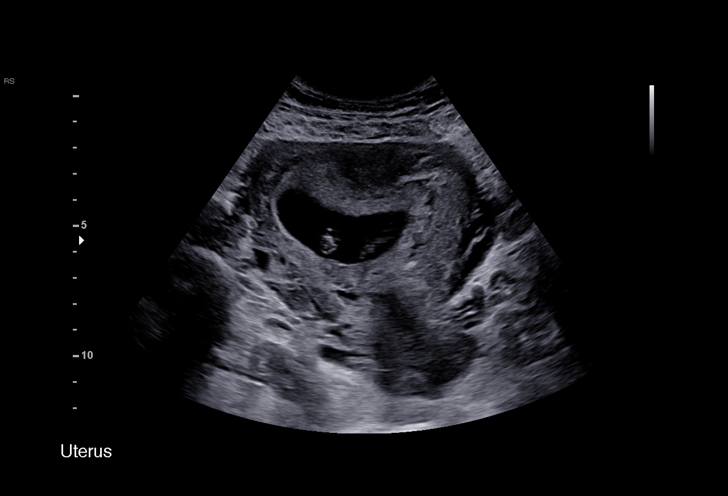
[im 21/41]
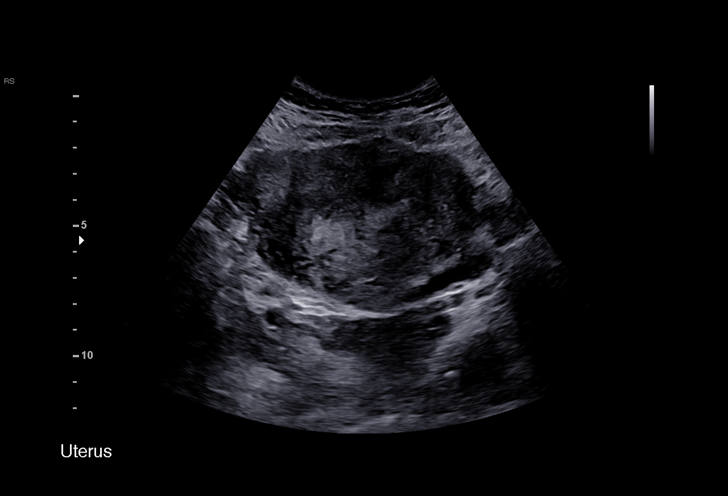
[im 23/41]
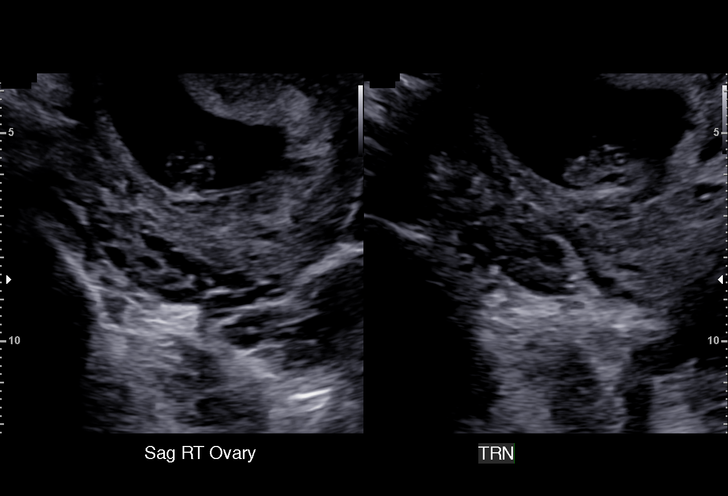
[im 26/41]
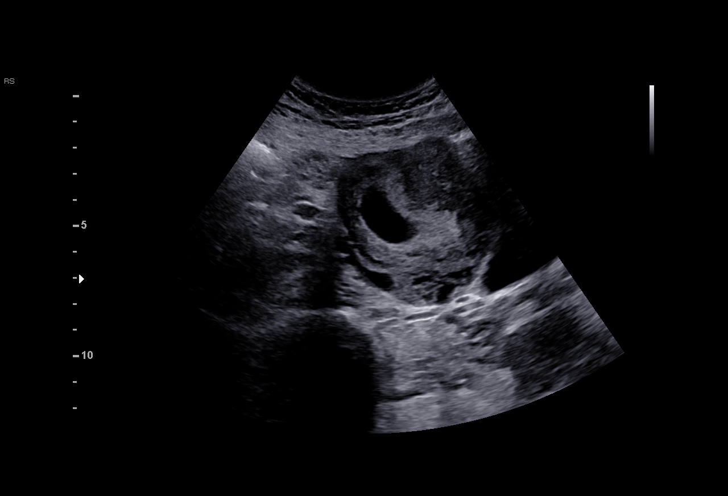
[im 29/41]
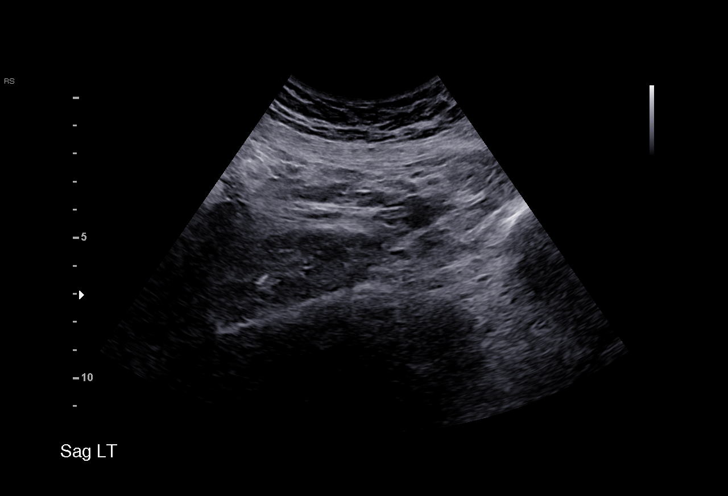
[im 32/41]
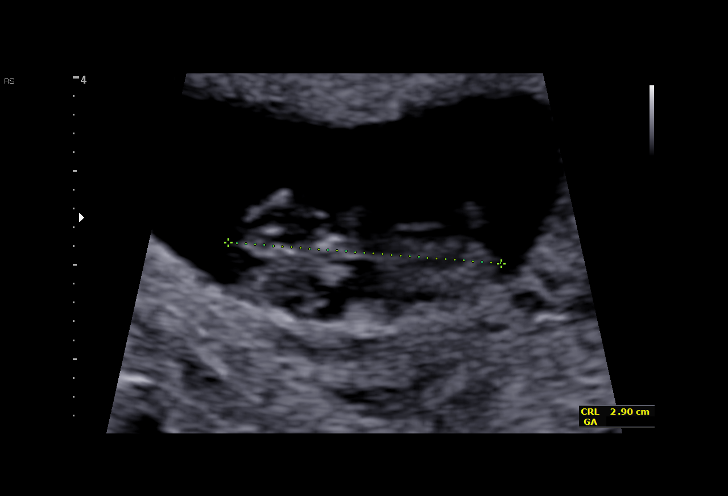
[im 35/41]
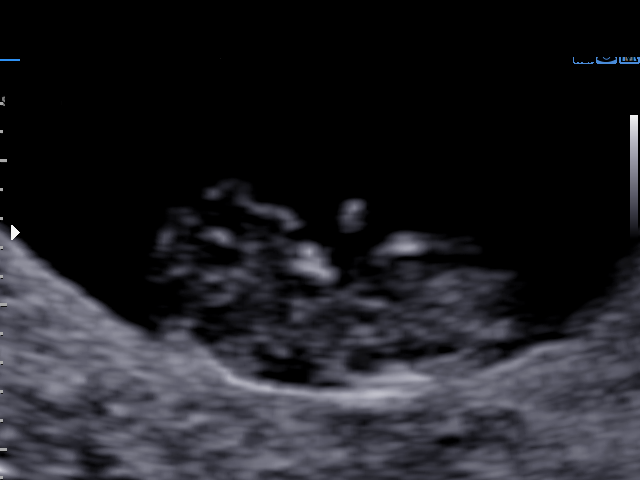
[im 38/41]
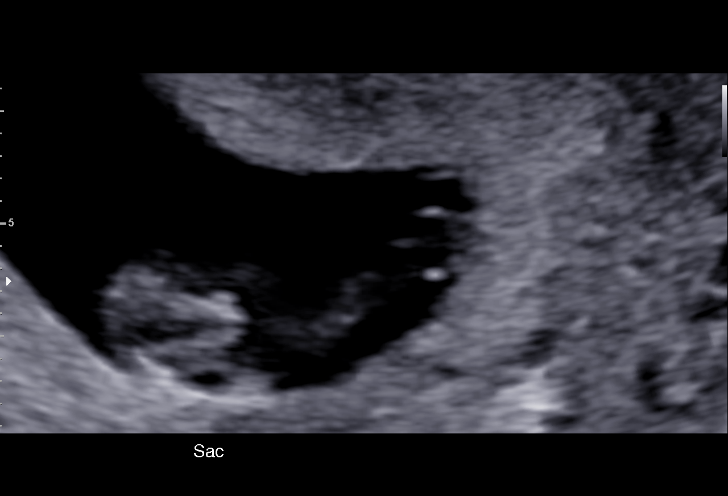
[im 41/41]
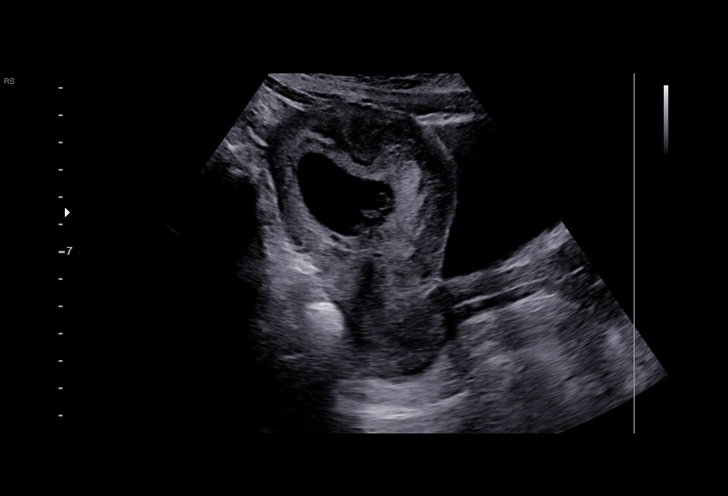

[15 of 28 positions shown; findings below may reference images not displayed]

FINDINGS: Intrauterine gestational sac: Single

Yolk sac:  Visualized

Embryo:  Visualized

Cardiac Activity: Visualized

Heart Rate: 180 bpm

MSD:   mm    w     d

CRL:   29.3  mm   9 w 5 d                  US EDC: 03/21/2018

Subchorionic hemorrhage:  Small subchorionic hemorrhage

Maternal uterus/adnexae: No adnexal mass.  Trace free fluid.
IMPRESSION: Nine week 5 day intrauterine pregnancy. Fetal heart rate 180 beats
per minute. Small subchorionic hemorrhage.

## 2022-03-13 ENCOUNTER — Ambulatory Visit: Payer: Medicaid Other

## 2022-03-21 ENCOUNTER — Ambulatory Visit: Payer: Medicaid Other

## 2022-04-26 ENCOUNTER — Ambulatory Visit (LOCAL_COMMUNITY_HEALTH_CENTER): Payer: Medicaid Other | Admitting: Physician Assistant

## 2022-04-26 ENCOUNTER — Encounter: Payer: Self-pay | Admitting: Physician Assistant

## 2022-04-26 VITALS — BP 110/76 | Ht 59.0 in | Wt 130.6 lb

## 2022-04-26 DIAGNOSIS — Z30015 Encounter for initial prescription of vaginal ring hormonal contraceptive: Secondary | ICD-10-CM

## 2022-04-26 DIAGNOSIS — Z309 Encounter for contraceptive management, unspecified: Secondary | ICD-10-CM

## 2022-04-26 DIAGNOSIS — Z0000 Encounter for general adult medical examination without abnormal findings: Secondary | ICD-10-CM

## 2022-04-26 DIAGNOSIS — Z3009 Encounter for other general counseling and advice on contraception: Secondary | ICD-10-CM

## 2022-04-26 LAB — HM HIV SCREENING LAB: HM HIV Screening: NEGATIVE

## 2022-04-26 MED ORDER — ETONOGESTREL-ETHINYL ESTRADIOL 0.12-0.015 MG/24HR VA RING: VAGINAL_RING | VAGINAL | 12 refills | Status: DC

## 2022-04-26 NOTE — Progress Notes (Signed)
[facility] DEPARTMENT [facility] [address] Main Number: [phone]    Family Planning Visit- Initial Visit  Subjective:  Kaitlyn Hardin is a 26 y.o.  G2P2002   being seen today for an initial annual visit and to discuss reproductive life planning.  The patient is currently using Female Condom for pregnancy prevention. Patient reports she/her/hers  does not want a pregnancy in the next year.    she/her/hers report they are looking for a method that provides High efficacy at preventing pregnancy, Method they can control starting/stopping, and Other does not want Nexplanon or IUD.  Patient has the following medical conditions has BV (bacterial vaginosis) and Asthma on their problem list.  Chief Complaint  Patient presents with   Annual Exam   Contraception    Patient reports last Pap >3 years ago, no h/o abnormal.  Patient denies concerns today.   Body mass index is 26.38 kg/m. - Patient is eligible for diabetes screening based on BMI and age >88?  no HA1C ordered? not applicable  Patient reports 3  partner/s in last year. Desires STI screening?  No - heavy menses/cramping today. Does want bloodwork for HIV/syphilis today, but wants to defer vaginal testing until menses resolved.  Has patient been screened once for HCV in the past?  No  No results found for: "HCVAB"  Does the patient have current drug use (including MJ), have a partner with drug use, and/or has been incarcerated since last result? No  If yes-- Screen for HCV through [facility]   Does the patient meet criteria for HBV testing? No  Criteria:  -Household, sexual or needle sharing contact with HBV -History of drug use -HIV positive -Those with known Hep C   Health Maintenance Due  Topic Date Due   Hepatitis C Screening  Never done   PAP-Cervical Cytology Screening  Never done   PAP SMEAR-Modifier  Never done   INFLUENZA VACCINE  04/03/2022    Review of Systems   Constitutional: Negative.   HENT: Negative.    Eyes: Negative.   Respiratory: Negative.    Cardiovascular: Negative.   Gastrointestinal: Negative.   Genitourinary: Negative.   Musculoskeletal: Negative.   Skin: Negative.   Neurological: Negative.   Endo/Heme/Allergies: Negative.   Psychiatric/Behavioral: Negative.      The following portions of the patient's history were reviewed and updated as appropriate: allergies, current medications, past family history, past medical history, past social history, past surgical history and problem list. Problem list updated.   See flowsheet for other program required questions.  Objective:   Vitals:   04/26/22 1546  BP: 110/76  Weight: 130 lb 9.6 oz (59.2 kg)  Height: 4\' 11"  (1.499 m)    Physical Exam Constitutional:      Appearance: Normal appearance.  HENT:     Head: Normocephalic and atraumatic.  Cardiovascular:     Rate and Rhythm: Normal rate and regular rhythm.     Heart sounds: Normal heart sounds.  Pulmonary:     Effort: Pulmonary effort is normal.     Breath sounds: Normal breath sounds.  Abdominal:     Palpations: Abdomen is soft.  Musculoskeletal:        General: Normal range of motion.  Skin:    General: Skin is warm and dry.  Neurological:     General: No focal deficit present.     Mental Status: She is alert and oriented to person, place, and time.  Psychiatric:  Mood and Affect: Mood normal.        Behavior: Behavior normal.       Assessment and Plan:  [name] is a 27 y.o. female presenting to the [facility] Department for an initial annual wellness/contraceptive visit  Contraception counseling: Reviewed options based on patient desire and reproductive life plan. Patient is interested in Vaginal Ring. This was provided to the patient today.  Risks, benefits, and typical effectiveness rates were reviewed.  Questions were answered.  Written information was also given to the  patient to review.    The patient will follow up in  12 months for surveillance.  The patient was told to call with any further questions, or with any concerns about this method of contraception.  Emphasized use of condoms 100% of the time for STI prevention.  Need for ECP was assessed. Patient reported > 120 hours .   1. Family planning - HIV [place] [facility] - Syphilis Serology, [place] [facility]  2. Encounter for well woman exam without gynecological exam Pt declined pelvic exam today due to first day of menses/cramping. To return within next 2 weeks for pelvic/Pap.  3. Encounter for initial prescription of vaginal ring hormonal contraceptive Start NuvaRing this Sunday. Discussed option for continuous dosing. eRx provided at patient request. - etonogestrel-ethinyl estradiol (NUVARING) 0.12-0.015 MG/24HR vaginal ring; Insert vaginally and leave in place for 3 consecutive weeks, then remove for 1 week.  Dispense: 1 each; Refill: 12   Return in about 2 weeks (around 05/10/2022) for pelvic/Pap.  Future Appointments  Date Time Provider Department Center  05/09/2022  1:20 PM [facility] PROVIDER [facility] None    Landry Dyke, PA-C

## 2022-04-26 NOTE — Progress Notes (Signed)
Patient here for PE and BCM. Unsure of last Pap, denies abnormal paps. Marland KitchenBurt Knack, RN

## 2022-05-09 ENCOUNTER — Ambulatory Visit: Payer: Medicaid Other

## 2023-05-03 ENCOUNTER — Ambulatory Visit (LOCAL_COMMUNITY_HEALTH_CENTER): Payer: Medicaid Other | Admitting: Advanced Practice Midwife

## 2023-05-03 VITALS — BP 102/74 | HR 79 | Ht 59.0 in | Wt 141.0 lb

## 2023-05-03 DIAGNOSIS — Z30015 Encounter for initial prescription of vaginal ring hormonal contraceptive: Secondary | ICD-10-CM

## 2023-05-03 DIAGNOSIS — Z3202 Encounter for pregnancy test, result negative: Secondary | ICD-10-CM | POA: Diagnosis not present

## 2023-05-03 DIAGNOSIS — Z803 Family history of malignant neoplasm of breast: Secondary | ICD-10-CM | POA: Insufficient documentation

## 2023-05-03 DIAGNOSIS — B9689 Other specified bacterial agents as the cause of diseases classified elsewhere: Secondary | ICD-10-CM

## 2023-05-03 DIAGNOSIS — Z3009 Encounter for other general counseling and advice on contraception: Secondary | ICD-10-CM

## 2023-05-03 DIAGNOSIS — Z309 Encounter for contraceptive management, unspecified: Secondary | ICD-10-CM | POA: Diagnosis not present

## 2023-05-03 LAB — HM HIV SCREENING LAB: HM HIV Screening: NEGATIVE

## 2023-05-03 LAB — WET PREP FOR TRICH, YEAST, CLUE
Trichomonas Exam: NEGATIVE
Yeast Exam: NEGATIVE

## 2023-05-03 LAB — PREGNANCY, URINE: Preg Test, Ur: NEGATIVE

## 2023-05-03 LAB — HM HEPATITIS C SCREENING LAB: HM Hepatitis Screen: NEGATIVE

## 2023-05-03 MED ORDER — ETONOGESTREL-ETHINYL ESTRADIOL 0.12-0.015 MG/24HR VA RING: VAGINAL_RING | VAGINAL | 12 refills | Status: DC

## 2023-05-03 MED ORDER — METRONIDAZOLE 500 MG PO TABS: 500.0000 mg | ORAL_TABLET | Freq: Two times a day (BID) | ORAL | Status: AC

## 2023-05-03 NOTE — Progress Notes (Signed)
[facility] [address] Main Number: [phone]   Family Planning Visit- Initial Visit  Subjective:  Kaitlyn Hardin is a 27 y.o. SHF nonsmoker G2P2002  (7,5) being seen today for an initial annual visit and to discuss reproductive life planning.  The patient is currently using Female Condom for pregnancy prevention. Patient reports she/her/hers  does not want a pregnancy in the next year.    she/her/hers report they are looking for a method that provides High efficacy at preventing pregnancy  Patient has the following medical conditions has BV (bacterial vaginosis) and Asthma on their problem list.  Chief Complaint  Patient presents with   Annual Exam    [facility]    Patient reports here for physical, pap, Nuvaring initiation. LMP 04/29/23. Last sex 01/19/23 with condom; with current partner x 2 mo; 1 partner in last 3 mo. Never had pap. Last ETOH 04/29/23 (1 mixed drink). Highest grade completed 12. Last dental exam 5 years ago. Not working. Not in school. Living with her mom and pt's 2 kids and her sister. Last PE 04/26/22. Was given e-rx at that time for Nuvaring and never picked up from pharmacy  Patient denies cigs, vaping, cigars, MJ  Body mass index is 28.48 kg/m. - Patient is eligible for diabetes screening based on BMI> 25 and age >35?  not applicable HA1C ordered? not applicable  Patient reports 4  partner/s in last year. Desires STI screening?  Yes  Has patient been screened once for HCV in the past?  No  No results found for: "HCVAB"  Does the patient have current drug use (including MJ), have a partner with drug use, and/or has been incarcerated since last result? No  If yes-- Screen for HCV through [facility]   Does the patient meet criteria for HBV testing? No  Criteria:  -Household, sexual or needle sharing contact with HBV -History of drug use -HIV positive -Those with known Hep C   Health Maintenance  Due  Topic Date Due   Hepatitis C Screening  Never done   PAP-Cervical Cytology Screening  Never done   PAP SMEAR-Modifier  Never done   COVID-19 Vaccine (1 - 2023-24 season) Never done   INFLUENZA VACCINE  04/04/2023    Review of Systems  All other systems reviewed and are negative.   The following portions of the patient's history were reviewed and updated as appropriate: allergies, current medications, past family history, past medical history, past social history, past surgical history and problem list. Problem list updated.   See flowsheet for other program required questions.  Objective:   Vitals:   05/03/23 1436  BP: 102/74  Pulse: 79  Weight: 141 lb (64 kg)  Height: 4\' 11"  (1.499 m)    Physical Exam Constitutional:      Appearance: Normal appearance. She is normal weight.  HENT:     Head: Normocephalic and atraumatic.     Mouth/Throat:     Mouth: Mucous membranes are moist.     Comments: Last dental exam 5 years ago Eyes:     Conjunctiva/sclera: Conjunctivae normal.  Neck:     Thyroid: No thyroid mass, thyromegaly or thyroid tenderness.  Cardiovascular:     Rate and Rhythm: Normal rate and regular rhythm.  Pulmonary:     Effort: Pulmonary effort is normal.     Breath sounds: Normal breath sounds.  Chest:  Breasts:    Right: Normal.     Left: Normal.  Abdominal:     Palpations: Abdomen is soft.     Comments: Soft without masses or tenderness, fair tone  Genitourinary:    General: Normal vulva.     Exam position: Lithotomy position.     Vagina: Normal. Vaginal discharge: clear leukorrhea, ph>4.5.     Cervix: Normal.     Uterus: Normal.      Adnexa: Right adnexa normal and left adnexa normal.     Rectum: Normal.     Comments: Pap done Musculoskeletal:        General: Normal range of motion.     Cervical back: Normal range of motion and neck supple.  Skin:    General: Skin is warm and dry.  Neurological:     Mental Status: She is alert.   Psychiatric:        Mood and Affect: Mood normal.       Assessment and Plan:  Kaitlyn Hardin is a 27 y.o. female presenting to the [facility] Department for an initial annual wellness/contraceptive visit  Contraception counseling: Reviewed options based on patient desire and reproductive life plan. Patient is interested in Vaginal Ring. This was provided to the patient today.  if not why not clearly documented  Risks, benefits, and typical effectiveness rates were reviewed.  Questions were answered.  Written information was also given to the patient to review.    The patient will follow up in  1 years for surveillance.  The patient was told to call with any further questions, or with any concerns about this method of contraception.  Emphasized use of condoms 100% of the time for STI prevention.  Educated on ECP and assessed for need of ECP. Patient reported not meeting criteria.  Reviewed options and patient desired No method of ECP, declined all    1. Family planning Treat wet mount per standing orders Immunization nurse consult Please give pt primary care provider list Please give pt dental list  - WET PREP FOR TRICH, YEAST, CLUE - Pregnancy, urine - Chlamydia/Gonorrhea [place] [facility] - HIV/HCV [place] [facility] - Syphilis Serology, [place] [facility] - IGP, [place] HPV  2. Encounter for initial prescription of vaginal ring hormonal contraceptive If PT neg today may have Nuvaring insert and leave in vagina x 3 wks, remove for 1 week for menses Pt counseled  abstinance next 7 days  - etonogestrel-ethinyl estradiol (NUVARING) 0.12-0.015 MG/24HR vaginal ring; Insert vaginally and leave in place for 3 consecutive weeks, then remove for 1 week.  Dispense: 1 each; Refill: 12   Return in about 1 year (around 05/02/2024) for yearly physical exam.  No future appointments.  Alberteen Spindle, CNM

## 2023-05-09 ENCOUNTER — Encounter: Payer: Self-pay | Admitting: Advanced Practice Midwife

## 2023-05-09 DIAGNOSIS — R87619 Unspecified abnormal cytological findings in specimens from cervix uteri: Secondary | ICD-10-CM | POA: Insufficient documentation

## 2023-05-09 LAB — IGP, APTIMA HPV
HPV Aptima: POSITIVE — AB
PAP Smear Comment: 0

## 2023-05-15 ENCOUNTER — Telehealth: Payer: Self-pay

## 2023-05-15 NOTE — Telephone Encounter (Signed)
Pt notified of positive STD results.  Appointment made for 05/03/2023 @ 8:15.  Berdie Ogren, RN

## 2023-05-16 ENCOUNTER — Ambulatory Visit: Payer: Medicaid Other

## 2023-05-16 DIAGNOSIS — A749 Chlamydial infection, unspecified: Secondary | ICD-10-CM

## 2023-05-16 MED ORDER — AZITHROMYCIN 500 MG PO TABS
1000.0000 mg | ORAL_TABLET | Freq: Once | ORAL | Status: AC
  Administered 2023-05-16: 1000 mg via ORAL

## 2023-05-16 NOTE — Progress Notes (Signed)
Pt is here for treatment of Chlamydia. LMP was 04/29/2023.  Pt was given Nuvaring on 05/03/2023. The patient was dispensed Azithromycin 500 mg #2 today. I provided counseling today regarding the medication. We discussed the medication, the side effects and when to call clinic. Patient given the opportunity to ask questions. Questions answered.  Chlamydia pamphlet and Azithromycin information sheet given to pt.  Condoms declined.  Berdie Ogren, RN

## 2023-08-13 ENCOUNTER — Ambulatory Visit: Payer: Medicaid Other | Admitting: Nurse Practitioner

## 2023-08-13 ENCOUNTER — Encounter: Payer: Self-pay | Admitting: Nurse Practitioner

## 2023-08-13 VITALS — BP 120/85 | HR 94 | Wt 145.0 lb

## 2023-08-13 DIAGNOSIS — Z113 Encounter for screening for infections with a predominantly sexual mode of transmission: Secondary | ICD-10-CM | POA: Diagnosis not present

## 2023-08-13 DIAGNOSIS — Z202 Contact with and (suspected) exposure to infections with a predominantly sexual mode of transmission: Secondary | ICD-10-CM

## 2023-08-13 DIAGNOSIS — Z30012 Encounter for prescription of emergency contraception: Secondary | ICD-10-CM

## 2023-08-13 DIAGNOSIS — B9689 Other specified bacterial agents as the cause of diseases classified elsewhere: Secondary | ICD-10-CM

## 2023-08-13 LAB — WET PREP FOR TRICH, YEAST, CLUE
Trichomonas Exam: NEGATIVE
Yeast Exam: NEGATIVE

## 2023-08-13 LAB — HM HIV SCREENING LAB: HM HIV Screening: NEGATIVE

## 2023-08-13 MED ORDER — DOXYCYCLINE HYCLATE 100 MG PO TABS: 100.0000 mg | ORAL_TABLET | Freq: Two times a day (BID) | ORAL | Status: AC

## 2023-08-13 MED ORDER — LEVONORGESTREL 1.5 MG PO TABS: 1.5000 mg | ORAL_TABLET | Freq: Once | ORAL | 0 refills | Status: AC

## 2023-08-13 MED ORDER — METRONIDAZOLE 500 MG PO TABS: 500.0000 mg | ORAL_TABLET | Freq: Two times a day (BID) | ORAL | Status: AC

## 2023-08-13 NOTE — Progress Notes (Unsigned)
Pt is here today for contraception. Wet prep reviewed with and patient dispensed doxycycline 100 mg 2x/day for 7 days and metronidazole 500 mg 2x/day for 7 days today. I provided counseling today regarding the medication. We discussed the medication, the side effects and when to call clinic. Patient given the opportunity to ask questions for any clarifications. Sonda Primes, RN.

## 2023-08-13 NOTE — Progress Notes (Unsigned)
[facility] Department  STI clinic/screening visit [address] [place] [address] [phone]  Subjective:  Kaitlyn Hardin is a 27 y.o. female being seen today for an STI screening visit. The patient reports they do have symptoms.  Patient reports that they do not desire a pregnancy in the next year.   They reported they are interested in discussing contraception today.    Patient's last menstrual period was 08/06/2023 (exact date).  Patient has the following medical conditions:   Patient Active Problem List   Diagnosis Date Noted   Abnormal Pap smear of cervix 05/03/23 neg HPV + 05/09/2023   Family history of breast cancer in males MGF and PGF 05/03/2023   Asthma 08/04/2015    Chief Complaint  Patient presents with   Contraception    Pt is here to change here BC method.    Patient presents to the clinic today requesting STI testing and would like to discuss contraception options. The patient reports last sex was 3 days prior and she has 1 female partner who has not been monogamous. She reports using vaginal ring and no condoms. She last removed the ring for the week of her period was was supposed to insert it 2 days ago for the new week, but did not. Patient is interested in ECP. Patient has a history of chlamydia diagnosed and treated 4 months ago; however, she is uncertain that her partner was treated and they have since had sex without condoms.    Does the patient using douching products? No  Last HIV test per patient/review of record was  Lab Results  Component Value Date   HMHIVSCREEN Negative - Validated 05/03/2023    Lab Results  Component Value Date   HIV Non-reactive 10/29/2017     Last HEPC test per patient/review of record was  Lab Results  Component Value Date   HMHEPCSCREEN Negative-Validated 05/03/2023   No components found for: "HEPC"   Last HEPB test per patient/review of record was No components found for: "HMHEPBSCREEN" No  components found for: "HEPC"   Patient reports last pap was No results found for: "DIAGPAP"  Lab Results  Component Value Date   SPECADGYN Comment 05/03/2023    Screening for MPX risk: Does the patient have an unexplained rash? {yes/no:20286} Is the patient MSM? {yes/no:20286} Does the patient endorse multiple sex partners or anonymous sex partners? {yes/no:20286} Did the patient have close or sexual contact with a person diagnosed with MPX? {yes/no:20286} Has the patient traveled outside the [place] where MPX is endemic? {yes/no:20286} Is there a high clinical suspicion for MPX-- evidenced by one of the following {yes/no:20286}  -Unlikely to be chickenpox  -Lymphadenopathy  -Rash that present in same phase of evolution on any given body part See flowsheet for further details and programmatic requirements.   Immunization history:  Immunization History  Administered Date(s) Administered   Hepatitis B 03/26/1994, 05/30/1994, 09/04/1994   MMR 09/16/2015   Tdap 07/06/2015, 12/25/2017     The following portions of the patient's history were reviewed and updated as appropriate: allergies, current medications, past medical history, past social history, past surgical history and problem list.  Objective:   Vitals:   08/13/23 1419  BP: 120/85  Pulse: 94  Weight: 145 lb (65.8 kg)    Physical Exam   Assessment and Plan:  Kaitlyn Hardin is a 27 y.o. female presenting to the [facility] Department for STI screening  1. Screening for venereal disease  - Chlamydia/Gonorrhea [place]  State Lab - HIV [place] [facility] - Syphilis Serology, [place] [facility] - WET PREP FOR TRICH, YEAST, CLUE  2. Emergency contraceptive counseling and prescription Patient had unprotected sex 3 days prior and has not inserted her vaginal ring on schedule.  - levonorgestrel (PLAN B ONE-STEP) 1.5 MG tablet; Take 1 tablet (1.5 mg total) by mouth once for 1 dose.  Dispense: 1 tablet; Refill: 0  3. Chlamydia  contact Patient positive for chlamydia in August (4 months ago). Patient treated but does not believe partner was treated and has had sexual contact with partner without a condom since. Patient also symptomatic.  - doxycycline (VIBRA-TABS) 100 MG tablet; Take 1 tablet (100 mg total) by mouth 2 (two) times daily for 7 days.  4. Bacterial vaginosis Amsel criteria met with patient report of abnormal discharge, amine odor on PE, and pH>4.5. Treated per CDC treatment guidelines.  - metroNIDAZOLE (FLAGYL) 500 MG tablet; Take 1 tablet (500 mg total) by mouth 2 (two) times daily for 7 days.   Patient accepted all screenings including vaginal CT/GC and bloodwork for HIV/RPR, and wet prep. Patient meets criteria for HepB screening? {yes/no:20286}. Ordered? {Response; yes/no/na:63} Patient meets criteria for HepC screening? {yes/no:20286}. Ordered? {Response; yes/no/na:63}  Treat wet prep per standing order Discussed time line for State Lab results and that patient will be called with positive results and encouraged patient to call if she had not heard in 2 weeks.  Counseled to return or seek care for continued or worsening symptoms Recommended repeat testing in 3 months with positive results. Recommended condom use with all sex  Patient is currently using {CCO Contraception:21020264} to prevent pregnancy.    Return if symptoms worsen or fail to improve.  No future appointments.  Edmonia James, NP

## 2023-09-18 ENCOUNTER — Telehealth: Payer: Self-pay

## 2023-09-18 NOTE — Telephone Encounter (Signed)
 Pt stated that she did not take her Metronidazole  and Doxycycline  because "I had a cold and was taking Tylenol ."  Pt wanted to know if she should take the medications now.  Pt informed that Tylenol  does not affect Metronidazole  or Doxycycline  and she should take her medications.  Pt verbalizes understanding.  Ferrell Hu, RN

## 2024-01-09 ENCOUNTER — Ambulatory Visit: Payer: Self-pay | Admitting: Family Medicine

## 2024-01-09 ENCOUNTER — Other Ambulatory Visit (HOSPITAL_COMMUNITY)
Admission: RE | Admit: 2024-01-09 | Discharge: 2024-01-09 | Disposition: A | Discharge status: Home / Self Care | Source: Ambulatory Visit | Attending: Family Medicine | Admitting: Family Medicine

## 2024-01-09 ENCOUNTER — Encounter: Payer: Self-pay | Admitting: Family Medicine

## 2024-01-09 VITALS — BP 116/78 | HR 90 | Temp 98.1°F | Ht 59.0 in | Wt 142.5 lb

## 2024-01-09 DIAGNOSIS — Z113 Encounter for screening for infections with a predominantly sexual mode of transmission: Secondary | ICD-10-CM

## 2024-01-09 DIAGNOSIS — R300 Dysuria: Secondary | ICD-10-CM

## 2024-01-09 LAB — POCT URINALYSIS DIPSTICK
Bilirubin, UA: NEGATIVE
Blood, UA: NEGATIVE
Glucose, UA: NEGATIVE
Ketones, UA: NEGATIVE
Leukocytes, UA: NEGATIVE
Nitrite, UA: NEGATIVE
Protein, UA: NEGATIVE
Spec Grav, UA: 1.02 (ref 1.010–1.025)
Urobilinogen, UA: 0.2 U/dL
pH, UA: 6.5 (ref 5.0–8.0)

## 2024-01-09 MED ORDER — METRONIDAZOLE 500 MG PO TABS: 500.0000 mg | ORAL_TABLET | Freq: Two times a day (BID) | ORAL | 0 refills | Status: DC

## 2024-01-09 NOTE — Progress Notes (Signed)
   Established Patient Office Visit  Subjective   Patient ID: Kaitlyn Hardin, female    DOB: 1995-12-13  Age: 27 y.o. MRN: [id number]  Chief Complaint  Patient presents with   Establish Care    28 y/o female with no PMH presents to the clinic to establish care.   Concerns:   Burning sensation since last 1 week.   Lower abdominal discomfort.  Denies fever, chills, vaginal discharge,back pain.  States she felt warm 2 days ago.   Has been eating and drinking fine.      Review of Systems  All other systems reviewed and are negative.     Objective:     BP 116/78   Pulse 90   Temp 98.1 F (36.7 C)   Ht 4\' 11"  (1.499 m)   Wt 142 lb 8 oz (64.6 kg)   LMP 12/09/2023   SpO2 95%   BMI 28.78 kg/m    Physical Exam Vitals and nursing note reviewed.  Constitutional:      Appearance: Normal appearance.  HENT:     Head: Normocephalic.     Right Ear: External ear normal.     Left Ear: External ear normal.  Eyes:     Conjunctiva/sclera: Conjunctivae normal.  Cardiovascular:     Rate and Rhythm: Normal rate.  Pulmonary:     Effort: Pulmonary effort is normal. No respiratory distress.  Abdominal:     Palpations: Abdomen is soft.  Musculoskeletal:        General: Normal range of motion.  Skin:    General: Skin is warm.  Neurological:     Mental Status: She is alert and oriented to person, place, and time.  Psychiatric:        Mood and Affect: Mood normal.      Results for orders placed or performed in visit on 01/09/24  POCT urinalysis dipstick  Result Value Ref Range   Color, UA yellow    Clarity, UA clear    Glucose, UA Negative Negative   Bilirubin, UA negative    Ketones, UA negative    Spec Grav, UA 1.020 1.010 - 1.025   Blood, UA negative    pH, UA 6.5 5.0 - 8.0   Protein, UA Negative Negative   Urobilinogen, UA 0.2 0.2 or 1.0 E.U./dL   Nitrite, UA negative    Leukocytes, UA Negative Negative   Appearance     Odor        The  ASCVD Risk score (Arnett DK, et al., 2019) failed to calculate for the following reasons:   The 2019 ASCVD risk score is only valid for ages 53 to 26    Assessment & Plan:   Problem List Items Addressed This Visit   None Visit Diagnoses       Dysuria    -  Primary   Relevant Medications   metroNIDAZOLE  (FLAGYL ) 500 MG tablet   Other Relevant Orders   POCT urinalysis dipstick (Completed)   Cervicovaginal ancillary only   HIV antibody (with reflex)     Screen for STD (sexually transmitted disease)       Relevant Orders   RPR       No follow-ups on file.    Vinary K [name], MD

## 2024-01-10 ENCOUNTER — Encounter: Payer: Self-pay | Admitting: Family Medicine

## 2024-01-10 LAB — HIV ANTIBODY (ROUTINE TESTING W REFLEX): HIV Screen 4th Generation wRfx: NONREACTIVE

## 2024-01-10 LAB — RPR: RPR Ser Ql: NONREACTIVE

## 2024-01-13 ENCOUNTER — Encounter: Payer: Self-pay | Admitting: Family Medicine

## 2024-01-13 LAB — CERVICOVAGINAL ANCILLARY ONLY
Bacterial Vaginitis (gardnerella): POSITIVE — AB
Candida Glabrata: NEGATIVE
Candida Vaginitis: NEGATIVE
Chlamydia: NEGATIVE
Comment: NEGATIVE
Comment: NEGATIVE
Comment: NEGATIVE
Comment: NEGATIVE
Comment: NEGATIVE
Comment: NORMAL
Neisseria Gonorrhea: NEGATIVE
Trichomonas: NEGATIVE

## 2024-02-10 ENCOUNTER — Ambulatory Visit: Admitting: Internal Medicine

## 2024-02-10 NOTE — Progress Notes (Deleted)
  [facility] [address] [place] [address] Dept: [phone] Dept Fax: [phone]  New Patient Office Visit  Subjective:   Kaitlyn Hardin 06/30/1996 02/10/2024  No chief complaint on file.   HPI: Kaitlyn Hardin presents today to establish care at [facility] [employer]. Introduced to Publishing rights manager role and practice setting.  All questions answered.  Concerns: See below      The following portions of the patient's history were reviewed and updated as appropriate: past medical history, past surgical history, family history, social history, allergies, medications, and problem list.   Patient Active Problem List   Diagnosis Date Noted   Abnormal Pap smear of cervix 05/03/23 neg HPV + 05/09/2023   Family history of breast cancer in males MGF and PGF 05/03/2023   Asthma 08/04/2015   Past Medical History:  Diagnosis Date   Anemia affecting pregnancy 08/04/2015   Asthma    hx of   Past Surgical History:  Procedure Laterality Date   NO PAST SURGERIES     Family History  Problem Relation Age of Onset   Cancer Maternal Grandfather    Cancer Paternal Grandfather     Current Outpatient Medications:    albuterol  (PROVENTIL  HFA;VENTOLIN  HFA) 108 (90 Base) MCG/ACT inhaler, Inhale 1-2 puffs into the lungs every 6 (six) hours as needed for wheezing or shortness of breath. (Patient not taking: Reported on 04/26/2022), Disp: , Rfl:    etonogestrel -ethinyl estradiol  (NUVARING) 0.12-0.015 MG/24HR vaginal ring, Insert vaginally and leave in place for 3 consecutive weeks, then remove for 1 week., Disp: 1 each, Rfl: 12   ibuprofen  (ADVIL ,MOTRIN ) 600 MG tablet, Take 1 tablet (600 mg total) by mouth every 6 (six) hours as needed for mild pain or moderate pain. (Patient not taking: Reported on 01/09/2024), Disp: 30 tablet, Rfl: 0   metroNIDAZOLE  (FLAGYL ) 500 MG tablet, Take 1 tablet (500 mg total)  by mouth 2 (two) times daily., Disp: 14 tablet, Rfl: 0 No Known Allergies  ROS: A complete ROS was performed with pertinent positives/negatives noted in the HPI. The remainder of the ROS are negative.   Objective:   There were no vitals filed for this visit.  GENERAL: Well-appearing, in NAD. Well nourished.  SKIN: Pink, warm and dry. No rash, lesion, ulceration, or ecchymoses.  NECK: Trachea midline. Full ROM w/o pain or tenderness. No lymphadenopathy.  RESPIRATORY: Chest wall symmetrical. Respirations even and non-labored. Breath sounds clear to auscultation bilaterally.  CARDIAC: S1, S2 present, regular rate and rhythm. Peripheral pulses 2+ bilaterally.  MSK: Muscle tone and strength appropriate for age. Joints w/o tenderness, redness, or swelling.  EXTREMITIES: Without clubbing, cyanosis, or edema.  NEUROLOGIC: No motor or sensory deficits. Steady, even gait.  PSYCH/MENTAL STATUS: Alert, oriented x 3. Cooperative, appropriate mood and affect.   Health Maintenance Due  Topic Date Due   Pneumococcal Vaccine 33-21 Years old (1 of 2 - PCV) Never done   COVID-19 Vaccine (1 - 2024-25 season) Never done    No results found for any visits on 02/10/24.  Assessment & Plan:   No orders of the defined types were placed in this encounter.  No orders of the defined types were placed in this encounter.   No follow-ups on file.   Gavin Kast, FNP

## 2024-02-11 ENCOUNTER — Ambulatory Visit (INDEPENDENT_AMBULATORY_CARE_PROVIDER_SITE_OTHER): Admitting: Family Medicine

## 2024-02-11 ENCOUNTER — Encounter: Payer: Self-pay | Admitting: Family Medicine

## 2024-02-11 VITALS — BP 112/78 | HR 89 | Ht 59.0 in | Wt 141.6 lb

## 2024-02-11 DIAGNOSIS — Z13220 Encounter for screening for lipoid disorders: Secondary | ICD-10-CM | POA: Diagnosis not present

## 2024-02-11 DIAGNOSIS — Z6828 Body mass index (BMI) 28.0-28.9, adult: Secondary | ICD-10-CM | POA: Diagnosis not present

## 2024-02-11 DIAGNOSIS — Z130 Encounter for screening for diseases of the blood and blood-forming organs and certain disorders involving the immune mechanism: Secondary | ICD-10-CM | POA: Insufficient documentation

## 2024-02-11 DIAGNOSIS — J45990 Exercise induced bronchospasm: Secondary | ICD-10-CM | POA: Insufficient documentation

## 2024-02-11 DIAGNOSIS — Z131 Encounter for screening for diabetes mellitus: Secondary | ICD-10-CM

## 2024-02-11 DIAGNOSIS — Z136 Encounter for screening for cardiovascular disorders: Secondary | ICD-10-CM

## 2024-02-11 MED ORDER — MONTELUKAST SODIUM 10 MG PO TABS: 10.0000 mg | ORAL_TABLET | Freq: Every day | ORAL | 3 refills | Status: DC

## 2024-02-11 NOTE — Progress Notes (Signed)
   Established Patient Office Visit  Subjective   Patient ID: Kaitlyn Hardin, female    DOB: [birth year]  Age: 28 y.o. MRN: [id number]  No chief complaint on file.    Assessment & Plan:   Problem List Items Addressed This Visit       Other   Screening for iron deficiency anemia   Relevant Orders   CBC with Differential   Other Visit Diagnoses       Exercise-induced asthma    -  Primary   Relevant Medications   montelukast (SINGULAIR) 10 MG tablet     Encounter for lipid screening for cardiovascular disease       Relevant Orders   Lipid panel     Diabetes mellitus screening       Relevant Orders   Hemoglobin A1c     BMI 28.0-28.9,adult       Relevant Orders   Comprehensive Metabolic Panel (CMET)       Return in about 3 months (around 05/13/2024) for well women exam with PAP.Aaron Aas   Here for routine follow up.   Patient speaks spanish and english. Offered interpreter service, pt declined.   Reports her BV resolved after completing antibiotics.   Overall her heath is normal. She does get sensation of chest tightness when she runs. She has history of Asthma. Last use of rescue inhaler was long time ago.   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 She is due for pap in 3 months. She will schedule a follow up for pap.        Review of Systems  All other systems reviewed and are negative.     Objective:     BP 112/78   Pulse 89   Ht 4\' 11"  (1.499 m)   Wt 141 lb 9.6 oz (64.2 kg)   LMP 02/10/2024 (Exact Date)   SpO2 98%   BMI 28.60 kg/m    Physical Exam Vitals and nursing note reviewed.  Constitutional:      Appearance: Normal appearance.  HENT:     Head: Normocephalic.     Right Ear: External ear normal.     Left Ear: External ear normal.  Eyes:     Conjunctiva/sclera: Conjunctivae normal.  Cardiovascular:     Rate and Rhythm: Normal rate.  Pulmonary:     Effort: Pulmonary effort is normal. No respiratory distress.  Abdominal:      Palpations: Abdomen is soft.  Musculoskeletal:        General: Normal range of motion.  Skin:    General: Skin is warm.  Neurological:     Mental Status: She is alert and oriented to person, place, and time.  Psychiatric:        Mood and Affect: Mood normal.      No results found for any visits on 02/11/24.    The ASCVD Risk score (Arnett DK, et al., 2019) failed to calculate for the following reasons:   The 2019 ASCVD risk score is only valid for ages 64 to 86      Oneta Bilberry, MD

## 2024-02-12 ENCOUNTER — Ambulatory Visit: Payer: Self-pay | Admitting: Family Medicine

## 2024-02-12 LAB — COMPREHENSIVE METABOLIC PANEL WITH GFR
ALT: 8 IU/L (ref 0–32)
AST: 14 IU/L (ref 0–40)
Albumin: 4.6 g/dL (ref 4.0–5.0)
Alkaline Phosphatase: 83 IU/L (ref 44–121)
BUN/Creatinine Ratio: 17 (ref 9–23)
BUN: 9 mg/dL (ref 6–20)
Bilirubin Total: 0.5 mg/dL (ref 0.0–1.2)
CO2: 21 mmol/L (ref 20–29)
Calcium: 9.2 mg/dL (ref 8.7–10.2)
Chloride: 104 mmol/L (ref 96–106)
Creatinine, Ser: 0.52 mg/dL — ABNORMAL LOW (ref 0.57–1.00)
Globulin, Total: 2.8 g/dL (ref 1.5–4.5)
Glucose: 86 mg/dL (ref 70–99)
Potassium: 4.1 mmol/L (ref 3.5–5.2)
Sodium: 140 mmol/L (ref 134–144)
Total Protein: 7.4 g/dL (ref 6.0–8.5)
eGFR: 128 mL/min/{1.73_m2} (ref >59)

## 2024-02-12 LAB — CBC WITH DIFFERENTIAL/PLATELET
Basophils Absolute: 0 10*3/uL (ref 0.0–0.2)
Basos: 0 %
EOS (ABSOLUTE): 0.1 10*3/uL (ref 0.0–0.4)
Eos: 2 %
Hematocrit: 38.6 % (ref 34.0–46.6)
Hemoglobin: 12.7 g/dL (ref 11.1–15.9)
Immature Grans (Abs): 0 10*3/uL (ref 0.0–0.1)
Immature Granulocytes: 0 %
Lymphocytes Absolute: 1.3 10*3/uL (ref 0.7–3.1)
Lymphs: 22 %
MCH: 30.4 pg (ref 26.6–33.0)
MCHC: 32.9 g/dL (ref 31.5–35.7)
MCV: 92 fL (ref 79–97)
Monocytes Absolute: 0.4 10*3/uL (ref 0.1–0.9)
Monocytes: 6 %
Neutrophils Absolute: 4.3 10*3/uL (ref 1.4–7.0)
Neutrophils: 70 %
Platelets: 277 10*3/uL (ref 150–450)
RBC: 4.18 x10E6/uL (ref 3.77–5.28)
RDW: 11.7 % (ref 11.7–15.4)
WBC: 6.2 10*3/uL (ref 3.4–10.8)

## 2024-02-12 LAB — LIPID PANEL
Chol/HDL Ratio: 3.1 ratio (ref 0.0–4.4)
Cholesterol, Total: 157 mg/dL (ref 100–199)
HDL: 50 mg/dL (ref >39)
LDL Chol Calc (NIH): 94 mg/dL (ref 0–99)
Triglycerides: 63 mg/dL (ref 0–149)
VLDL Cholesterol Cal: 13 mg/dL (ref 5–40)

## 2024-02-12 LAB — HEMOGLOBIN A1C
Est. average glucose Bld gHb Est-mCnc: 94 mg/dL
Hgb A1c MFr Bld: 4.9 % (ref 4.8–5.6)

## 2024-03-04 ENCOUNTER — Ambulatory Visit (INDEPENDENT_AMBULATORY_CARE_PROVIDER_SITE_OTHER): Admitting: Family Medicine

## 2024-03-04 ENCOUNTER — Encounter: Payer: Self-pay | Admitting: Family Medicine

## 2024-03-04 ENCOUNTER — Other Ambulatory Visit: Payer: Self-pay | Admitting: Family Medicine

## 2024-03-04 VITALS — BP 104/78 | HR 76 | Ht 59.0 in | Wt 141.0 lb

## 2024-03-04 DIAGNOSIS — R399 Unspecified symptoms and signs involving the genitourinary system: Secondary | ICD-10-CM

## 2024-03-04 DIAGNOSIS — Z113 Encounter for screening for infections with a predominantly sexual mode of transmission: Secondary | ICD-10-CM

## 2024-03-04 DIAGNOSIS — R300 Dysuria: Secondary | ICD-10-CM | POA: Diagnosis not present

## 2024-03-04 LAB — POCT URINALYSIS DIPSTICK
Bilirubin, UA: NEGATIVE
Blood, UA: NEGATIVE
Glucose, UA: NEGATIVE
Ketones, UA: NEGATIVE
Protein, UA: NEGATIVE
Spec Grav, UA: 1.02 (ref 1.010–1.025)
Urobilinogen, UA: 0.2 U/dL
pH, UA: 7 (ref 5.0–8.0)

## 2024-03-04 MED ORDER — SULFAMETHOXAZOLE-TRIMETHOPRIM 800-160 MG PO TABS: 1.0000 | ORAL_TABLET | Freq: Two times a day (BID) | ORAL | 0 refills | Status: DC

## 2024-03-04 NOTE — Progress Notes (Signed)
   Established Patient Office Visit  Subjective   Patient ID: Kaitlyn Hardin, female    DOB: November 08, 1995  Age: 28 y.o. MRN: [id number]  Chief Complaint  Patient presents with   Urinary Tract Infection    Pt is experiencing burning and pain when urinating. Frequent urination. X 1 week ago.      Assessment & Plan:   Problem List Items Addressed This Visit   None Visit Diagnoses       UTI symptoms    -  Primary   Relevant Orders   POCT Urinalysis Dipstick (Completed)   Urine Culture     Dysuria       Relevant Medications   sulfamethoxazole -trimethoprim  (BACTRIM  DS) 800-160 MG tablet     Screen for STD (sexually transmitted disease)       Relevant Orders   HIV Antibody (routine testing w rflx)   RPR   GC/Chlamydia Probe Amp      1. Medications: TMP/SMX 2. Maintain adequate hydration 3. Follow up if symptoms not improving, and prn. No follow-ups on file.   Subjective:  Kaitlyn Hardin is a 28 y.o. female who complains of burning with urination, dysuria, and frequency for 1 week. Patient denies back pain, fever, headache, stomach ache, and vaginal discharge.  Patient does not have a history of recurrent UTI.  Patient does not have a history of pyelonephritis.       Review of Systems  All other systems reviewed and are negative.     Objective:     BP 104/78   Pulse 76   Ht 4' 11 (1.499 m)   Wt 141 lb (64 kg)   LMP 02/10/2024 (Exact Date)   SpO2 98%   BMI 28.48 kg/m    Physical Exam Vitals and nursing note reviewed.  Constitutional:      Appearance: Normal appearance.  HENT:     Head: Normocephalic.     Right Ear: External ear normal.     Left Ear: External ear normal.  Eyes:     Conjunctiva/sclera: Conjunctivae normal.  Cardiovascular:     Rate and Rhythm: Normal rate.  Abdominal:     Palpations: Abdomen is soft.  Musculoskeletal:        General: Normal range of motion.  Skin:    General: Skin is warm.  Neurological:      Mental Status: She is alert and oriented to person, place, and time.  Psychiatric:        Mood and Affect: Mood normal.      Results for orders placed or performed in visit on 03/04/24  POCT Urinalysis Dipstick  Result Value Ref Range   Color, UA Yellow    Clarity, UA Cloudy    Glucose, UA Negative Negative   Bilirubin, UA Negative    Ketones, UA Negative    Spec Grav, UA 1.020 1.010 - 1.025   Blood, UA Negative    pH, UA 7.0 5.0 - 8.0   Protein, UA Negative Negative   Urobilinogen, UA 0.2 0.2 or 1.0 E.U./dL   Nitrite, UA Postive    Leukocytes, UA Moderate (2+) (A) Negative   Appearance     Odor        The ASCVD Risk score (Arnett DK, et al., 2019) failed to calculate for the following reasons:   The 2019 ASCVD risk score is only valid for ages 60 to 1      Ladoris MARLA Ny, MD

## 2024-03-05 ENCOUNTER — Ambulatory Visit: Payer: Self-pay | Admitting: Family Medicine

## 2024-03-05 LAB — HIV ANTIBODY (ROUTINE TESTING W REFLEX): HIV Screen 4th Generation wRfx: NONREACTIVE

## 2024-03-05 LAB — RPR: RPR Ser Ql: NONREACTIVE

## 2024-03-08 LAB — GC/CHLAMYDIA PROBE AMP
Chlamydia trachomatis, NAA: NEGATIVE
Neisseria Gonorrhoeae by PCR: NEGATIVE

## 2024-03-09 ENCOUNTER — Ambulatory Visit: Payer: Self-pay | Admitting: Family Medicine

## 2024-03-09 ENCOUNTER — Other Ambulatory Visit: Payer: Self-pay | Admitting: Family Medicine

## 2024-03-09 LAB — URINE CULTURE

## 2024-03-09 MED ORDER — CIPROFLOXACIN HCL 500 MG PO TABS: 500.0000 mg | ORAL_TABLET | Freq: Two times a day (BID) | ORAL | 0 refills | Status: AC

## 2024-03-10 NOTE — Telephone Encounter (Signed)
 Please review.  KP

## 2024-04-23 ENCOUNTER — Ambulatory Visit: Admitting: Family Medicine

## 2024-04-24 ENCOUNTER — Encounter: Payer: Self-pay | Admitting: Family Medicine

## 2024-04-24 ENCOUNTER — Ambulatory Visit (INDEPENDENT_AMBULATORY_CARE_PROVIDER_SITE_OTHER): Admitting: Family Medicine

## 2024-04-24 ENCOUNTER — Other Ambulatory Visit (HOSPITAL_COMMUNITY)
Admission: RE | Admit: 2024-04-24 | Discharge: 2024-04-24 | Disposition: A | Discharge status: Home / Self Care | Source: Ambulatory Visit | Attending: Family Medicine | Admitting: Family Medicine

## 2024-04-24 VITALS — BP 114/74 | HR 89 | Ht 59.0 in | Wt 143.4 lb

## 2024-04-24 DIAGNOSIS — N898 Other specified noninflammatory disorders of vagina: Secondary | ICD-10-CM | POA: Diagnosis present

## 2024-04-24 DIAGNOSIS — R300 Dysuria: Secondary | ICD-10-CM | POA: Diagnosis not present

## 2024-04-24 LAB — POCT URINALYSIS DIPSTICK
Bilirubin, UA: NEGATIVE
Blood, UA: NEGATIVE
Glucose, UA: NEGATIVE
Ketones, UA: NEGATIVE
Leukocytes, UA: NEGATIVE
Nitrite, UA: NEGATIVE
Protein, UA: NEGATIVE
Spec Grav, UA: 1.025 (ref 1.010–1.025)
Urobilinogen, UA: 0.2 U/dL
pH, UA: 6 (ref 5.0–8.0)

## 2024-04-24 MED ORDER — METRONIDAZOLE 500 MG PO TABS: 500.0000 mg | ORAL_TABLET | Freq: Two times a day (BID) | ORAL | 0 refills | Status: AC

## 2024-04-24 MED ORDER — FLUCONAZOLE 150 MG PO TABS: 150.0000 mg | ORAL_TABLET | Freq: Once | ORAL | 0 refills | Status: AC

## 2024-04-24 NOTE — Progress Notes (Signed)
 Established Patient Office Visit  Subjective   Patient ID: Kaitlyn Hardin, female    DOB: [birth year]  Age: 28 y.o. MRN: [id number]  Chief Complaint  Patient presents with   Recurrent UTI   Vaginal Itching    Patient reports having vaginal itching, was seen before for the same thing. Antibiotics helped but now is having the same symptoms      Assessment & Plan:   Problem List Items Addressed This Visit   None Visit Diagnoses       Dysuria    -  Primary   Relevant Orders   POCT urinalysis dipstick (Completed)     Vaginal itching       Relevant Medications   metroNIDAZOLE  (FLAGYL ) 500 MG tablet   fluconazole  (DIFLUCAN ) 150 MG tablet   Other Relevant Orders   Cervicovaginal ancillary only   HIV antibody (with reflex)     Assessment and Plan   No follow-ups on file.   HPI Discussed the use of AI scribe software for clinical note transcription with the patient, who gave verbal consent to proceed.  History of Present Illness Kaitlyn Hardin is a 28 year old female who presents with persistent itching and urinary symptoms.  She experiences persistent itching and urinary symptoms. Ciprofloxacin  has not alleviated her symptoms, although it improved urination. The itching persists without discharge, but she observes a 'liquid like white' substance. She denies fever, nausea, vomiting, or lower abdominal pain. She experienced nausea and mild pain for two days, attributed to not eating. She adheres to her birth control regimen.     Review of Systems  All other systems reviewed and are negative.     Objective:     BP 114/74   Pulse 89   Ht 4' 11 (1.499 m)   Wt 143 lb 6 oz (65 kg)   LMP 03/23/2024   SpO2 98%   BMI 28.96 kg/m    Physical Exam Vitals and nursing note reviewed.  Constitutional:      Appearance: Normal appearance.  HENT:     Head: Normocephalic.     Right Ear: External ear normal.     Left Ear: External ear normal.  Eyes:      Conjunctiva/sclera: Conjunctivae normal.  Cardiovascular:     Rate and Rhythm: Normal rate.  Pulmonary:     Effort: Pulmonary effort is normal. No respiratory distress.  Abdominal:     Palpations: Abdomen is soft.  Musculoskeletal:        General: Normal range of motion.  Skin:    General: Skin is warm.  Neurological:     Mental Status: She is alert and oriented to person, place, and time.  Psychiatric:        Mood and Affect: Mood normal.      Results for orders placed or performed in visit on 04/24/24  POCT urinalysis dipstick  Result Value Ref Range   Color, UA amber    Clarity, UA clear    Glucose, UA Negative Negative   Bilirubin, UA negative    Ketones, UA negative    Spec Grav, UA 1.025 1.010 - 1.025   Blood, UA negative    pH, UA 6.0 5.0 - 8.0   Protein, UA Negative Negative   Urobilinogen, UA 0.2 0.2 or 1.0 E.U./dL   Nitrite, UA negative    Leukocytes, UA Negative Negative   Appearance     Odor        The ASCVD Risk score (Arnett  DK, et al., 2019) failed to calculate for the following reasons:   The 2019 ASCVD risk score is only valid for ages 2 to 49      Kaitlyn MARLA Ny, MD

## 2024-04-28 ENCOUNTER — Ambulatory Visit: Payer: Self-pay | Admitting: Family Medicine

## 2024-04-28 LAB — CERVICOVAGINAL ANCILLARY ONLY
Bacterial Vaginitis (gardnerella): NEGATIVE
Candida Glabrata: NEGATIVE
Candida Vaginitis: POSITIVE — AB
Chlamydia: NEGATIVE
Comment: NEGATIVE
Comment: NEGATIVE
Comment: NEGATIVE
Comment: NEGATIVE
Comment: NEGATIVE
Comment: NORMAL
Neisseria Gonorrhea: NEGATIVE
Trichomonas: NEGATIVE

## 2024-05-14 ENCOUNTER — Ambulatory Visit (INDEPENDENT_AMBULATORY_CARE_PROVIDER_SITE_OTHER): Admitting: Family Medicine

## 2024-05-14 ENCOUNTER — Encounter: Payer: Self-pay | Admitting: Family Medicine

## 2024-05-14 VITALS — BP 118/64 | HR 83 | Ht 59.0 in | Wt 139.0 lb

## 2024-05-14 DIAGNOSIS — R399 Unspecified symptoms and signs involving the genitourinary system: Secondary | ICD-10-CM | POA: Diagnosis not present

## 2024-05-14 LAB — POCT URINALYSIS DIPSTICK
Bilirubin, UA: NEGATIVE
Blood, UA: NEGATIVE
Glucose, UA: NEGATIVE
Ketones, UA: NEGATIVE
Nitrite, UA: NEGATIVE
Protein, UA: NEGATIVE
Spec Grav, UA: <=1.005 — AB (ref 1.010–1.025)
Urobilinogen, UA: 0.2 U/dL
pH, UA: 7 (ref 5.0–8.0)

## 2024-05-14 MED ORDER — NITROFURANTOIN MONOHYD MACRO 100 MG PO CAPS: 100.0000 mg | ORAL_CAPSULE | Freq: Two times a day (BID) | ORAL | 0 refills | Status: AC

## 2024-05-14 NOTE — Progress Notes (Signed)
   Acute Office Visit  Subjective:     Patient ID: Kaitlyn Hardin, female    DOB: 06-03-1996, 28 y.o.   MRN: [id number]  Chief Complaint  Patient presents with   Annual Exam    X 3 week, Burning when urinating, getting worse     Subjective:  Kaitlyn Hardin is a 28 y.o. female who complains of burning with urination and dysuria for a few weeks.  Patient also complains of vaginal discharge. Patient denies back pain and fever.  Patient does have a history of recurrent UTI.  Patient does not have a history of pyelonephritis. Pt has been using Azo and douching.          Review of Systems  All other systems reviewed and are negative.       Objective:    BP 118/64   Pulse 83   Ht 4' 11 (1.499 m)   Wt 139 lb (63 kg)   LMP 03/23/2024   SpO2 98%   BMI 28.07 kg/m    Physical Exam Vitals and nursing note reviewed.  Constitutional:      Appearance: Normal appearance.  HENT:     Head: Normocephalic.     Right Ear: External ear normal.     Left Ear: External ear normal.  Eyes:     Conjunctiva/sclera: Conjunctivae normal.  Cardiovascular:     Rate and Rhythm: Normal rate.  Pulmonary:     Effort: Pulmonary effort is normal. No respiratory distress.  Abdominal:     Palpations: Abdomen is soft.  Musculoskeletal:        General: Normal range of motion.  Skin:    General: Skin is warm.  Neurological:     Mental Status: She is alert and oriented to person, place, and time.  Psychiatric:        Mood and Affect: Mood normal.     Results for orders placed or performed in visit on 05/14/24  POCT Urinalysis Dipstick  Result Value Ref Range   Color, UA yellow    Clarity, UA clear    Glucose, UA Negative Negative   Bilirubin, UA neg    Ketones, UA neg    Spec Grav, UA <=1.005 (A) 1.010 - 1.025   Blood, UA neg    pH, UA 7.0 5.0 - 8.0   Protein, UA Negative Negative   Urobilinogen, UA 0.2 0.2 or 1.0 E.U./dL   Nitrite, UA neg    Leukocytes, UA  Small (1+) (A) Negative   Appearance yellow,clear    Odor none         Assessment & Plan:   Problem List Items Addressed This Visit   None Visit Diagnoses       UTI symptoms    -  Primary   Relevant Medications   nitrofurantoin , macrocrystal-monohydrate, (MACROBID ) 100 MG capsule   Other Relevant Orders   POCT Urinalysis Dipstick (Completed)   Urine Culture       Meds ordered this encounter  Medications   nitrofurantoin , macrocrystal-monohydrate, (MACROBID ) 100 MG capsule    Sig: Take 1 capsule (100 mg total) by mouth 2 (two) times daily for 7 days.    Dispense:  14 capsule    Refill:  0  Plan:  1. Medications: nitrofurantoin  2. Maintain adequate hydration 3. Follow up if symptoms not improving, and prn.  Return in about 4 weeks (around 06/11/2024) for annual.  Vinary K Raley Novicki, MD

## 2024-05-16 LAB — URINE CULTURE

## 2024-05-18 ENCOUNTER — Ambulatory Visit: Payer: Self-pay | Admitting: Family Medicine

## 2024-07-13 ENCOUNTER — Ambulatory Visit: Admitting: Family Medicine

## 2024-07-13 ENCOUNTER — Encounter: Payer: Self-pay | Admitting: Family Medicine

## 2024-08-10 ENCOUNTER — Other Ambulatory Visit: Payer: Self-pay | Admitting: Advanced Practice Midwife

## 2024-08-10 DIAGNOSIS — Z30015 Encounter for initial prescription of vaginal ring hormonal contraceptive: Secondary | ICD-10-CM
